# Patient Record
Sex: Male | Born: 1950 | Race: White | Hispanic: No | Marital: Married | Smoking: Never smoker
Health system: Southern US, Community
[De-identification: ages and names within clinical notes are randomized; demographics above are authoritative.]

## PROBLEM LIST (undated history)

## (undated) DIAGNOSIS — T8859XA Other complications of anesthesia, initial encounter: Secondary | ICD-10-CM

## (undated) DIAGNOSIS — C801 Malignant (primary) neoplasm, unspecified: Secondary | ICD-10-CM

## (undated) DIAGNOSIS — G4733 Obstructive sleep apnea (adult) (pediatric): Secondary | ICD-10-CM

## (undated) DIAGNOSIS — I10 Essential (primary) hypertension: Secondary | ICD-10-CM

## (undated) DIAGNOSIS — K219 Gastro-esophageal reflux disease without esophagitis: Secondary | ICD-10-CM

## (undated) DIAGNOSIS — F319 Bipolar disorder, unspecified: Secondary | ICD-10-CM

## (undated) DIAGNOSIS — R011 Cardiac murmur, unspecified: Secondary | ICD-10-CM

## (undated) DIAGNOSIS — E039 Hypothyroidism, unspecified: Secondary | ICD-10-CM

## (undated) DIAGNOSIS — Z87442 Personal history of urinary calculi: Secondary | ICD-10-CM

## (undated) DIAGNOSIS — E785 Hyperlipidemia, unspecified: Secondary | ICD-10-CM

## (undated) HISTORY — DX: Obstructive sleep apnea (adult) (pediatric): G47.33

## (undated) HISTORY — PX: JOINT REPLACEMENT: SHX530

## (undated) HISTORY — PX: REPLACEMENT TOTAL KNEE: SUR1224

## (undated) HISTORY — DX: Hyperlipidemia, unspecified: E78.5

## (undated) HISTORY — DX: Bipolar disorder, unspecified: F31.9

## (undated) HISTORY — DX: Essential (primary) hypertension: I10

## (undated) HISTORY — DX: Hypothyroidism, unspecified: E03.9

## (undated) HISTORY — PX: COLONOSCOPY: SHX174

## (undated) HISTORY — PX: OTHER SURGICAL HISTORY: SHX169

## (undated) HISTORY — DX: Gastro-esophageal reflux disease without esophagitis: K21.9

---

## 2007-07-12 HISTORY — PX: CARDIAC CATHETERIZATION: SHX172

## 2007-07-20 ENCOUNTER — Emergency Department (HOSPITAL_COMMUNITY): Admission: EM | Admit: 2007-07-20 | Discharge: 2007-07-20 | Payer: Self-pay | Admitting: Emergency Medicine

## 2007-08-10 ENCOUNTER — Encounter: Admission: RE | Admit: 2007-08-10 | Discharge: 2007-08-10 | Payer: Self-pay | Admitting: Family Medicine

## 2010-08-01 ENCOUNTER — Encounter: Payer: Self-pay | Admitting: Family Medicine

## 2010-10-05 ENCOUNTER — Ambulatory Visit
Admission: RE | Admit: 2010-10-05 | Discharge: 2010-10-05 | Disposition: A | Discharge status: Home / Self Care | Payer: 59 | Source: Ambulatory Visit | Attending: Family Medicine | Admitting: Family Medicine

## 2010-10-05 ENCOUNTER — Other Ambulatory Visit: Payer: Self-pay | Admitting: Family Medicine

## 2010-10-05 DIAGNOSIS — M25561 Pain in right knee: Secondary | ICD-10-CM

## 2011-02-01 ENCOUNTER — Ambulatory Visit (INDEPENDENT_AMBULATORY_CARE_PROVIDER_SITE_OTHER): Payer: 59 | Admitting: Family Medicine

## 2011-02-01 ENCOUNTER — Encounter: Payer: Self-pay | Admitting: Family Medicine

## 2011-02-01 VITALS — BP 123/87 | HR 103 | Temp 98.1°F | Ht 74.0 in | Wt 299.4 lb

## 2011-02-01 DIAGNOSIS — M25569 Pain in unspecified knee: Secondary | ICD-10-CM

## 2011-02-01 DIAGNOSIS — M25561 Pain in right knee: Secondary | ICD-10-CM | POA: Insufficient documentation

## 2011-02-01 NOTE — Progress Notes (Signed)
Subjective:    Patient ID: Noah Ward, male    DOB: Nov 01, 1950, 60 y.o.   MRN: [id number]  PCP: Dr. Luz Brazen  HPI 60 yo M here for right knee pain.  Patient reports back in 2001 when he jumped out of a truck with a machine gun while in the [employer] he twisted his right knee causing patellar dislocation. Went to ER - was put back in place and given a brace. This was first injury to this knee. Has never had surgery on this knee. He reports intermittent pain in right knee since that time but really bothering him past 6 months. Had x-rays showing patellofemoral DJD but these were non-weight bearing.  Medial and lateral joint spaces well preserved. About 6 weeks ago had cortisone injection in his knee for medial pain that helped a lot for a month. Pain recurred and on Friday had another injection. Feels better than before the injection. 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 Feels a catch and knee will buckle on him medially. No recurrent dislocation.  No lateral pain.  No locking. Takes tramadol as needed for pain.  Past Medical History  Diagnosis Date  . Hypertension   . Hyperlipidemia   . Hypothyroidism   . Bipolar affective disorder     No current outpatient prescriptions on file prior to visit.    History reviewed. No pertinent past surgical history.  No Known Allergies  History   Social History  . Marital Status: Married    Spouse Name: N/A    Number of Children: N/A  . Years of Education: N/A   Occupational History  . Not on file.   Social History Main Topics  . Smoking status: Never Smoker   . Smokeless tobacco: Not on file  . Alcohol Use: Not on file  . Drug Use: Not on file  . Sexually Active: Not on file   Other Topics Concern  . Not on file   Social History Narrative  . No narrative on file    Family History  Problem Relation Age of Onset  . Hypertension Mother   . Hypertension Father   .  Hyperlipidemia Father   . Heart attack Father   . Hypertension Brother   . Diabetes Maternal Aunt   . Hypertension Maternal Aunt   . Sudden death Neg Hx     BP 123/87  Pulse 103  Temp(Src) 98.1 F (36.7 C) (Oral)  Ht 6\' 2"  (1.88 m)  Wt 299 lb 6.4 oz (135.807 kg)  BMI 38.44 kg/m2  Review of Systems See HPI above.    Objective:   Physical Exam Gen: NAD  R knee: No gross deformity, ecchymoses, swelling. 1+ crepitation. No TTP currently - reported when I palpated medial joint line that this is where pain was prior to his knee injection Friday. FROM. 1+ anterior drawer and lachmanns.  Negative post drawer. Negative valgus/varus testing. Negative mcmurrays, apleys, patellar apprehension, clarkes. NV intact distally.  L knee: FROM without pain, swelling, weakness, or instability.  Negative ant drawer and lachmanns.    Assessment & Plan:  1. Right knee pain - 2/2 degenerative meniscal tear vs DJD.  While he has laxity on ACL testing and a prior history of patellar dislocation, these do not seem to be accounting for patients pain which is at medial joint line.  No true locking.  Just given an intraarticular cortisone injection which has helped.  Advised him if  he does not continue to improve with this for > 3 months relief, would then consider standing AP of his right knee (prior radiographs were with him lying down) and possibly MRI based on those results to assess for degenerative medial meniscal tear.  He does get what may be a catch medially causing buckling of this knee - possible this is a meniscal tear that flips into joint space.  Continue tramadol.  Tylenol, nsaids, icing.  Discussed possibility of PT as well but deferred for now.  See instructions for further.

## 2011-02-01 NOTE — Patient Instructions (Signed)
Take tylenol 500mg  1-2 tabs three times a Tacey for pain. Aleve 1-2 tabs twice a Arreola with food as needed Glucosamine sulfate 750mg  twice a Dowe is a supplement that has been shown to help moderate to severe arthritis. Capsaicin topically up to four times a Hoeffner may also help with pain. Cortisone injections are an option. It's important that you continue to stay active. If you are overweight, try to lose weight through diet and exercise. Consider physical therapy to strengthen muscles around the joint that hurts to take pressure off of the joint itself. Heat or ice 15 minutes at a time 3-4 times a Weesner as needed to help with pain. If you do not continue to improve I would consider standing AP (front to back) x-ray of your knee and MRI - you don't have much in the way of arthritis on the inside of your knee where most of your pain is suggesting it may be a degenerative meniscus tear that's causing your pain.

## 2011-02-01 NOTE — Assessment & Plan Note (Signed)
2/2 degenerative meniscal tear vs DJD.  While he has laxity on ACL testing and a prior history of patellar dislocation, these do not seem to be accounting for patients pain which is at medial joint line.  No true locking.  Just given an intraarticular cortisone injection which has helped.  Advised him if he does not continue to improve with this for > 3 months relief, would then consider standing AP of his right knee (prior radiographs were with him lying down) and possibly MRI based on those results to assess for degenerative medial meniscal tear.  He does get what may be a catch medially causing buckling of this knee - possible this is a meniscal tear that flips into joint space.  Continue tramadol.  Tylenol, nsaids, icing.  Discussed possibility of PT as well but deferred for now.  See instructions for further.

## 2011-03-31 LAB — BASIC METABOLIC PANEL
CO2: 28
Calcium: 8.8
Creatinine, Ser: 1.06
GFR calc Af Amer: >60
GFR calc non Af Amer: >60
Sodium: 132 — ABNORMAL LOW

## 2011-03-31 LAB — CBC
Hemoglobin: 13.6
RBC: 4.42
WBC: 10.3

## 2011-03-31 LAB — DIFFERENTIAL
Basophils Relative: 0
Lymphocytes Relative: 22
Lymphs Abs: 2.3
Monocytes Absolute: 1.3 — ABNORMAL HIGH
Monocytes Relative: 13 — ABNORMAL HIGH
Neutro Abs: 6.5
Neutrophils Relative %: 63

## 2011-06-14 ENCOUNTER — Emergency Department (INDEPENDENT_AMBULATORY_CARE_PROVIDER_SITE_OTHER): Payer: 59

## 2011-06-14 ENCOUNTER — Emergency Department (HOSPITAL_BASED_OUTPATIENT_CLINIC_OR_DEPARTMENT_OTHER)
Admission: EM | Admit: 2011-06-14 | Discharge: 2011-06-15 | Disposition: A | Discharge status: Home / Self Care | Payer: 59 | Attending: Emergency Medicine | Admitting: Emergency Medicine

## 2011-06-14 ENCOUNTER — Encounter (HOSPITAL_BASED_OUTPATIENT_CLINIC_OR_DEPARTMENT_OTHER): Payer: Self-pay | Admitting: Emergency Medicine

## 2011-06-14 DIAGNOSIS — E039 Hypothyroidism, unspecified: Secondary | ICD-10-CM | POA: Insufficient documentation

## 2011-06-14 DIAGNOSIS — I872 Venous insufficiency (chronic) (peripheral): Secondary | ICD-10-CM | POA: Insufficient documentation

## 2011-06-14 DIAGNOSIS — K5900 Constipation, unspecified: Secondary | ICD-10-CM | POA: Insufficient documentation

## 2011-06-14 DIAGNOSIS — Z79899 Other long term (current) drug therapy: Secondary | ICD-10-CM | POA: Insufficient documentation

## 2011-06-14 DIAGNOSIS — E785 Hyperlipidemia, unspecified: Secondary | ICD-10-CM | POA: Insufficient documentation

## 2011-06-14 DIAGNOSIS — I10 Essential (primary) hypertension: Secondary | ICD-10-CM | POA: Insufficient documentation

## 2011-06-14 DIAGNOSIS — I878 Other specified disorders of veins: Secondary | ICD-10-CM

## 2011-06-14 LAB — CBC
HCT: 34.6 % — ABNORMAL LOW (ref 39.0–52.0)
Hemoglobin: 11.3 g/dL — ABNORMAL LOW (ref 13.0–17.0)
RBC: 3.85 MIL/uL — ABNORMAL LOW (ref 4.22–5.81)
WBC: 14 10*3/uL — ABNORMAL HIGH (ref 4.0–10.5)

## 2011-06-14 MED ORDER — ONDANSETRON HCL 4 MG/2ML IJ SOLN
4.0000 mg | Freq: Once | INTRAMUSCULAR | Status: AC
  Administered 2011-06-15: 4 mg via INTRAVENOUS
  Filled 2011-06-14: qty 2

## 2011-06-14 MED ORDER — SODIUM CHLORIDE 0.9 % IV BOLUS (SEPSIS)
1000.0000 mL | Freq: Once | INTRAVENOUS | Status: AC
  Administered 2011-06-15: 1000 mL via INTRAVENOUS

## 2011-06-14 NOTE — ED Provider Notes (Signed)
History     CSN: [id number] Arrival date & time: 06/14/2011  9:04 PM   First MD Initiated Contact with Patient 06/14/11 2312      Chief Complaint  Patient presents with  . Constipation    (Consider location/radiation/quality/duration/timing/severity/associated sxs/prior treatment) HPI Patient here with constipation since knee replacement 8 days ago.  Patient taking oxycodone for pain.  States decreased po intake and denies any bowel movements.  No abdominal pain.  Patient has taken four senna plus a [place].  Patient states he is weak.  He has taken miralax last night but continues to have no stool.     Past Medical History  Diagnosis Date  . Hypertension   . Hyperlipidemia   . Hypothyroidism   . Bipolar affective disorder     Past Surgical History  Procedure Date  . Joint replacement     Family History  Problem Relation Age of Onset  . Hypertension Mother   . Hypertension Father   . Hyperlipidemia Father   . Heart attack Father   . Hypertension Brother   . Diabetes Maternal Aunt   . Hypertension Maternal Aunt   . Sudden death Neg Hx     History  Substance Use Topics  . Smoking status: Never Smoker   . Smokeless tobacco: Not on file  . Alcohol Use: Not on file      Review of Systems  Constitutional: Positive for activity change and appetite change.  All other systems reviewed and are negative.    Allergies  Sulfa antibiotics  Home Medications   Current Outpatient Rx  Name Route Sig Dispense Refill  . AMLODIPINE BESY-BENAZEPRIL HCL 5-20 MG PO CAPS Oral Take 1 capsule by mouth daily.     . CELECOXIB 200 MG PO CAPS Oral Take 200 mg by mouth 2 (two) times daily.      Marland Kitchen VITAMIN D 1000 UNITS PO TABS Oral Take 1,000 Units by mouth daily.      Marland Kitchen DIVALPROEX SODIUM 500 MG PO TBEC Oral Take 1,000 mg by mouth at bedtime.     Marland Kitchen ENOXAPARIN SODIUM 30 MG/0.3ML [place] SOLN Subcutaneous Inject 30 mg into the skin every 12 (twelve) hours.      Marland Kitchen LEVOTHYROXINE SODIUM 25 MCG  PO TABS Oral Take 25 mcg by mouth daily.     Marland Kitchen ONE-DAILY MULTI VITAMINS PO TABS Oral Take 1 tablet by mouth daily.      . OXYCODONE HCL 5 MG PO CAPS Oral Take 5-15 mg by mouth every 3 (three) hours as needed. For pain. Take 1 tab for minor pain; take 2 tabs for moderate pain and 3 tabs for severe pain     . POLYSACCHARIDE IRON 150 MG PO CAPS Oral Take 150 mg by mouth daily.      Marland Kitchen SIMVASTATIN 10 MG PO TABS Oral Take 10 mg by mouth at bedtime.     . TESTOSTERONE ENANTHATE 200 MG/ML IM OIL Intramuscular Inject 200 mg into the muscle every 14 (fourteen) days. For IM use only     . TRAMADOL HCL 50 MG PO TABS        BP 103/61  Pulse 111  Temp(Src) 98.6 F (37 C) (Oral)  Resp 18  Wt 301 lb (136.533 kg)  SpO2 96%  Physical Exam  Nursing note and vitals reviewed. Constitutional: He is oriented to person, place, and time. He appears well-developed and well-nourished.       obese  HENT:  Head: Normocephalic and atraumatic.  Eyes:  Conjunctivae and EOM are normal. Pupils are equal, round, and reactive to light.  Neck: Normal range of motion. Neck supple.  Cardiovascular: Normal rate and regular rhythm.   Pulmonary/Chest: Effort normal and breath sounds normal.  Abdominal: Soft.  Musculoskeletal:       Right knee with dressing place, knee with steri strips some swelling diffusely, erythema right calf with some warmth no well delineated.  Neurological: He is alert and oriented to person, place, and time. He has normal reflexes.  Skin: Skin is warm and dry.    ED Course  Procedures (including critical care time)  Labs Reviewed - No data to display Dg Abd 1 View  06/14/2011  *RADIOLOGY REPORT*  Clinical Data: Constipation  ABDOMEN - 1 VIEW  Comparison: None.  Findings: Moderate to large amount of stool in the right colon. Retained stool also in the transverse and left colon but to a lesser degree than in the right colon.  Negative for bowel obstruction.  No dilated bowel loops are seen. No  acute bony abnormality.  No renal calculi.  IMPRESSION: Constipation without bowel obstruction.  Original Report Authenticated By: Camelia Phenes, M.D.     No diagnosis found.    MDM  Patient with constipation without s/s obstruction.  Nurse checked rectum and no stool palpable per rn.  Patient with some erythema rle below recent knee surgery which appears c.w. Venous stasis but some warmth and wbc at 14,000.  Plan antibiotics, laxative, advised regarding bowel habits and patient to follow up with pmd tomorrow.          Hilario Quarry, MD 06/15/11 [phone]

## 2011-06-14 NOTE — ED Notes (Signed)
Pt had knee replacement surgery, discharged 8 days ago. Pt has not had no BM x 8 days.

## 2011-06-15 LAB — DIFFERENTIAL
Basophils Relative: 0 % (ref 0–1)
Eosinophils Relative: 2 % (ref 0–5)
Lymphocytes Relative: 21 % (ref 12–46)
Monocytes Relative: 12 % (ref 3–12)
Neutro Abs: 9.1 10*3/uL — ABNORMAL HIGH (ref 1.7–7.7)
Neutrophils Relative %: 65 % (ref 43–77)

## 2011-06-15 LAB — COMPREHENSIVE METABOLIC PANEL
ALT: 25 U/L (ref 0–53)
Alkaline Phosphatase: 63 U/L (ref 39–117)
BUN: 25 mg/dL — ABNORMAL HIGH (ref 6–23)
Chloride: 98 mEq/L (ref 96–112)
GFR calc Af Amer: >90 mL/min (ref >90)
Glucose, Bld: 103 mg/dL — ABNORMAL HIGH (ref 70–99)
Potassium: 5.1 mEq/L (ref 3.5–5.1)
Sodium: 134 mEq/L — ABNORMAL LOW (ref 135–145)
Total Bilirubin: 1.1 mg/dL (ref 0.3–1.2)

## 2011-06-15 MED ORDER — DOXYCYCLINE HYCLATE 100 MG PO TABS
100.0000 mg | ORAL_TABLET | Freq: Once | ORAL | Status: AC
  Administered 2011-06-15: 100 mg via ORAL
  Filled 2011-06-15: qty 1

## 2011-06-15 MED ORDER — LEVOFLOXACIN 500 MG PO TABS: 500.0000 mg | ORAL_TABLET | Freq: Every day | ORAL | Status: AC

## 2011-06-15 MED ORDER — MAGNESIUM CITRATE PO SOLN: 296.0000 mL | Freq: Once | ORAL | Status: AC

## 2013-06-21 ENCOUNTER — Encounter (HOSPITAL_BASED_OUTPATIENT_CLINIC_OR_DEPARTMENT_OTHER): Payer: Self-pay | Admitting: Emergency Medicine

## 2013-06-21 ENCOUNTER — Emergency Department (HOSPITAL_BASED_OUTPATIENT_CLINIC_OR_DEPARTMENT_OTHER)

## 2013-06-21 ENCOUNTER — Emergency Department (HOSPITAL_BASED_OUTPATIENT_CLINIC_OR_DEPARTMENT_OTHER)
Admission: EM | Admit: 2013-06-21 | Discharge: 2013-06-21 | Disposition: A | Discharge status: Home / Self Care | Attending: Emergency Medicine | Admitting: Emergency Medicine

## 2013-06-21 DIAGNOSIS — R296 Repeated falls: Secondary | ICD-10-CM | POA: Insufficient documentation

## 2013-06-21 DIAGNOSIS — S8392XA Sprain of unspecified site of left knee, initial encounter: Secondary | ICD-10-CM

## 2013-06-21 DIAGNOSIS — Y9301 Activity, walking, marching and hiking: Secondary | ICD-10-CM | POA: Insufficient documentation

## 2013-06-21 DIAGNOSIS — F319 Bipolar disorder, unspecified: Secondary | ICD-10-CM | POA: Insufficient documentation

## 2013-06-21 DIAGNOSIS — Z79899 Other long term (current) drug therapy: Secondary | ICD-10-CM | POA: Insufficient documentation

## 2013-06-21 DIAGNOSIS — I10 Essential (primary) hypertension: Secondary | ICD-10-CM | POA: Insufficient documentation

## 2013-06-21 DIAGNOSIS — Y929 Unspecified place or not applicable: Secondary | ICD-10-CM | POA: Insufficient documentation

## 2013-06-21 DIAGNOSIS — E785 Hyperlipidemia, unspecified: Secondary | ICD-10-CM | POA: Insufficient documentation

## 2013-06-21 DIAGNOSIS — E039 Hypothyroidism, unspecified: Secondary | ICD-10-CM | POA: Insufficient documentation

## 2013-06-21 DIAGNOSIS — S93409A Sprain of unspecified ligament of unspecified ankle, initial encounter: Secondary | ICD-10-CM | POA: Insufficient documentation

## 2013-06-21 DIAGNOSIS — Z791 Long term (current) use of non-steroidal anti-inflammatories (NSAID): Secondary | ICD-10-CM | POA: Insufficient documentation

## 2013-06-21 DIAGNOSIS — IMO0002 Reserved for concepts with insufficient information to code with codable children: Secondary | ICD-10-CM | POA: Insufficient documentation

## 2013-06-21 DIAGNOSIS — X500XXA Overexertion from strenuous movement or load, initial encounter: Secondary | ICD-10-CM | POA: Insufficient documentation

## 2013-06-21 MED ORDER — TRAMADOL HCL 50 MG PO TABS: 50.0000 mg | ORAL_TABLET | Freq: Four times a day (QID) | ORAL | Status: DC | PRN

## 2013-06-21 NOTE — ED Notes (Addendum)
Pt states that he had his left knee replaced in March at [facility] and today was walking and a hole covered with leaves he fell in, pt landed mainly on left leg and has left knee pain and left ankle pain. Pt states that he twisted and turned both knee and ankle. Pt cap refill less this 3, CNS intact, and pt able to wiggle toes. Ice applied to both knee and ankle for comfort.

## 2013-06-21 NOTE — ED Provider Notes (Signed)
CSN: [id number]     Arrival date & time 06/21/13  1521 History   First MD Initiated Contact with Patient 06/21/13 1536     Chief Complaint  Patient presents with  . Knee Injury   (Consider location/radiation/quality/duration/timing/severity/associated sxs/prior Treatment) Patient is a 62 y.o. male presenting with fall. The history is provided by the patient.  Fall This is a new problem. The current episode started less than 1 hour ago. The problem occurs constantly. The problem has not changed since onset.Associated symptoms comments: Stepped in a hole covered by leaves causing him to fall and twist his left knee and ankle.  Unable to bear weight since due to severe ankle and moderate knee pain.  No head injury or LOC.  No neck or back pain.. The symptoms are aggravated by walking and standing. The symptoms are relieved by ice and rest. Treatments tried: ice. The treatment provided mild relief.    Past Medical History  Diagnosis Date  . Hypertension   . Hyperlipidemia   . Hypothyroidism   . Bipolar affective disorder    Past Surgical History  Procedure Laterality Date  . Joint replacement     Family History  Problem Relation Age of Onset  . Hypertension Mother   . Hypertension Father   . Hyperlipidemia Father   . Heart attack Father   . Hypertension Brother   . Diabetes Maternal Aunt   . Hypertension Maternal Aunt   . Sudden death Neg Hx    History  Substance Use Topics  . Smoking status: Never Smoker   . Smokeless tobacco: Not on file  . Alcohol Use: Not on file    Review of Systems  All other systems reviewed and are negative.    Allergies  Sulfa antibiotics  Home Medications   Current Outpatient Rx  Name  Route  Sig  Dispense  Refill  . amLODipine-benazepril (LOTREL) 5-20 MG per capsule   Oral   Take 1 capsule by mouth daily.          . celecoxib (CELEBREX) 200 MG capsule   Oral   Take 200 mg by mouth 2 (two) times daily.           .  cholecalciferol (VITAMIN D) 1000 UNITS tablet   Oral   Take 1,000 Units by mouth daily.           . divalproex (DEPAKOTE) 500 MG EC tablet   Oral   Take 1,000 mg by mouth at bedtime.          . enoxaparin (LOVENOX) 30 MG/0.3ML SOLN   Subcutaneous   Inject 30 mg into the skin every 12 (twelve) hours.           Marland Kitchen levothyroxine (SYNTHROID, LEVOTHROID) 25 MCG tablet   Oral   Take 25 mcg by mouth daily.          . Multiple Vitamin (MULTIVITAMIN) tablet   Oral   Take 1 tablet by mouth daily.           Marland Kitchen oxycodone (OXY-IR) 5 MG capsule   Oral   Take 5-15 mg by mouth every 3 (three) hours as needed. For pain. Take 1 tab for minor pain; take 2 tabs for moderate pain and 3 tabs for severe pain          . polysaccharide iron (NIFEREX) 150 MG CAPS capsule   Oral   Take 150 mg by mouth daily.           Marland Kitchen  simvastatin (ZOCOR) 10 MG tablet   Oral   Take 10 mg by mouth at bedtime.          Marland Kitchen testosterone enanthate (DELATESTRYL) 200 MG/ML injection   Intramuscular   Inject 200 mg into the muscle every 14 (fourteen) days. For IM use only          . traMADol (ULTRAM) 50 MG tablet               . traMADol (ULTRAM) 50 MG tablet   Oral   Take 1 tablet (50 mg total) by mouth every 6 (six) hours as needed.   15 tablet   0    BP 169/106  Pulse 107  Temp(Src) 98.9 F (37.2 C) (Oral)  Resp 16  Ht 6\' 2"  (1.88 m)  Wt 297 lb (134.718 kg)  BMI 38.12 kg/m2  SpO2 98% Physical Exam  Nursing note and vitals reviewed. Constitutional: He is oriented to person, place, and time. He appears well-developed and well-nourished. No distress.  HENT:  Head: Normocephalic and atraumatic.  Eyes: EOM are normal. Pupils are equal, round, and reactive to light.  Cardiovascular: Normal rate.   Pulmonary/Chest: Effort normal.  Musculoskeletal:       Left knee: He exhibits swelling and ecchymosis. He exhibits normal range of motion. Tenderness found. Lateral joint line tenderness noted.        Left ankle: He exhibits decreased range of motion, swelling and ecchymosis. Tenderness. Lateral malleolus tenderness found. No head of 5th metatarsal and no proximal fibula tenderness found.  >90 degree flexion of the left knee  Neurological: He is alert and oriented to person, place, and time. He has normal strength. No sensory deficit.  Skin: Skin is warm and dry. No rash noted. No erythema.  Psychiatric: He has a normal mood and affect. His behavior is normal.    ED Course  Procedures (including critical care time) Labs Review Labs Reviewed - No data to display Imaging Review Dg Ankle Complete Left  06/21/2013   CLINICAL DATA:  Left ankle pain  EXAM: LEFT ANKLE COMPLETE - 3+ VIEW  COMPARISON:  None.  FINDINGS: Generalized soft tissue swelling is noted both medially and laterally. No acute fracture or dislocation is noted. Calcaneal spurs are seen. No other focal abnormality is noted.  IMPRESSION: Soft tissue swelling without acute bony abnormality.   Electronically Signed   By: Alcide Clever M.D.   On: 06/21/2013 16:00   Dg Knee Complete 4 Views Left  06/21/2013   CLINICAL DATA:  Traumatic injury with pain  EXAM: LEFT KNEE - COMPLETE 4+ VIEW  COMPARISON:  None.  FINDINGS: Left knee prosthesis is noted. No loosening is seen. No acute fracture or dislocation is noted. Small joint effusion is noted.  IMPRESSION: Small joint effusion.  No acute bony abnormality is seen.   Electronically Signed   By: Alcide Clever M.D.   On: 06/21/2013 15:58    EKG Interpretation   None       MDM   1. Knee sprain and strain, left, initial encounter   2. Ankle sprain and strain, left, initial encounter     Patient here for mechanical fall where he stepped in a hole and twisted his knee and ankle. Patient has a prior history of a complete left knee replacement. He has greater than 90 flexion of the left knee with pain over the lateral aspect and mild bruising. Left ankle with significant pain and  swelling over the lateral malleolus. 2+  DP and PT pulses. No fibular head tenderness. Currently both joints do not seem unstable. Plain films without acute fracture or or problems with the left knee prosthesis. Left knee wrapped and placed in an ankle splint. Patient will follow up with his orthopedist in one to 2 weeks. He has a walker at home    Gwyneth Sprout, MD 06/21/13 [phone]

## 2013-06-21 NOTE — ED Notes (Signed)
Pt c/o left knee and ankle injury with fall x 1 hr ago

## 2014-01-06 ENCOUNTER — Encounter: Payer: Self-pay | Admitting: *Deleted

## 2015-05-14 ENCOUNTER — Emergency Department (HOSPITAL_BASED_OUTPATIENT_CLINIC_OR_DEPARTMENT_OTHER): Payer: Medicare Other

## 2015-05-14 ENCOUNTER — Emergency Department (HOSPITAL_BASED_OUTPATIENT_CLINIC_OR_DEPARTMENT_OTHER)
Admission: EM | Admit: 2015-05-14 | Discharge: 2015-05-14 | Disposition: A | Discharge status: Home / Self Care | Payer: Medicare Other | Attending: Emergency Medicine | Admitting: Emergency Medicine

## 2015-05-14 ENCOUNTER — Encounter (HOSPITAL_BASED_OUTPATIENT_CLINIC_OR_DEPARTMENT_OTHER): Payer: Self-pay | Admitting: *Deleted

## 2015-05-14 DIAGNOSIS — Z791 Long term (current) use of non-steroidal anti-inflammatories (NSAID): Secondary | ICD-10-CM | POA: Diagnosis not present

## 2015-05-14 DIAGNOSIS — E039 Hypothyroidism, unspecified: Secondary | ICD-10-CM | POA: Insufficient documentation

## 2015-05-14 DIAGNOSIS — R42 Dizziness and giddiness: Secondary | ICD-10-CM | POA: Diagnosis present

## 2015-05-14 DIAGNOSIS — Z79899 Other long term (current) drug therapy: Secondary | ICD-10-CM | POA: Diagnosis not present

## 2015-05-14 DIAGNOSIS — F319 Bipolar disorder, unspecified: Secondary | ICD-10-CM | POA: Diagnosis not present

## 2015-05-14 DIAGNOSIS — Z9981 Dependence on supplemental oxygen: Secondary | ICD-10-CM | POA: Diagnosis not present

## 2015-05-14 DIAGNOSIS — E785 Hyperlipidemia, unspecified: Secondary | ICD-10-CM | POA: Diagnosis not present

## 2015-05-14 DIAGNOSIS — G4733 Obstructive sleep apnea (adult) (pediatric): Secondary | ICD-10-CM | POA: Insufficient documentation

## 2015-05-14 DIAGNOSIS — I10 Essential (primary) hypertension: Secondary | ICD-10-CM | POA: Diagnosis not present

## 2015-05-14 DIAGNOSIS — Z8719 Personal history of other diseases of the digestive system: Secondary | ICD-10-CM | POA: Insufficient documentation

## 2015-05-14 LAB — COMPREHENSIVE METABOLIC PANEL
ALBUMIN: 4 g/dL (ref 3.5–5.0)
ALT: 18 U/L (ref 17–63)
ANION GAP: 7 (ref 5–15)
AST: 21 U/L (ref 15–41)
Alkaline Phosphatase: 51 U/L (ref 38–126)
BILIRUBIN TOTAL: 0.7 mg/dL (ref 0.3–1.2)
BUN: 15 mg/dL (ref 6–20)
CHLORIDE: 103 mmol/L (ref 101–111)
CO2: 26 mmol/L (ref 22–32)
Calcium: 9.2 mg/dL (ref 8.9–10.3)
Creatinine, Ser: 1.05 mg/dL (ref 0.61–1.24)
GFR calc Af Amer: >60 mL/min (ref >60)
GFR calc non Af Amer: >60 mL/min (ref >60)
GLUCOSE: 102 mg/dL — AB (ref 65–99)
POTASSIUM: 4.5 mmol/L (ref 3.5–5.1)
SODIUM: 136 mmol/L (ref 135–145)
TOTAL PROTEIN: 7.3 g/dL (ref 6.5–8.1)

## 2015-05-14 LAB — CBC
HEMATOCRIT: 45.1 % (ref 39.0–52.0)
Hemoglobin: 14.8 g/dL (ref 13.0–17.0)
MCH: 29.6 pg (ref 26.0–34.0)
MCHC: 32.8 g/dL (ref 30.0–36.0)
MCV: 90.2 fL (ref 78.0–100.0)
Platelets: 221 10*3/uL (ref 150–400)
RBC: 5 MIL/uL (ref 4.22–5.81)
RDW: 13.7 % (ref 11.5–15.5)
WBC: 7 10*3/uL (ref 4.0–10.5)

## 2015-05-14 LAB — TROPONIN I: Troponin I: <0.03 ng/mL (ref <0.031)

## 2015-05-14 MED ORDER — MECLIZINE HCL 50 MG PO TABS
25.0000 mg | ORAL_TABLET | Freq: Three times a day (TID) | ORAL | Status: DC | PRN
Start: 2015-05-14 — End: 2021-04-01

## 2015-05-14 MED ORDER — MECLIZINE HCL 25 MG PO TABS
25.0000 mg | ORAL_TABLET | Freq: Once | ORAL | Status: AC
  Administered 2015-05-14: 25 mg via ORAL
  Filled 2015-05-14: qty 1

## 2015-05-14 NOTE — ED Provider Notes (Signed)
CSN: [id number]     Arrival date & time 05/14/15  1144 History   First MD Initiated Contact with Patient 05/14/15 1323     Chief Complaint  Patient presents with  . Dizziness     (Consider location/radiation/quality/duration/timing/severity/associated sxs/prior Treatment) HPI Complaint of dizziness meeting sensation of room spinning onset yesterday afternoon. Symptoms last for 15-20 seconds at a time worse with moving his head improved with remaining still also associated with nausea. No other symptoms. No treatment prior to coming here no headache no diplopia and no focal numbness or weakness. He feels improved today over yesterday. Past Medical History  Diagnosis Date  . Hypertension   . Hyperlipidemia   . Hypothyroidism   . Bipolar affective disorder ([place])   . Esophageal reflux   . OSA treated with BiPAP    Past Surgical History  Procedure Laterality Date  . Joint replacement    . Colonoscopy    . Cardiac catheterization  2009   Family History  Problem Relation Age of Onset  . Hypertension Mother   . Hypertension Father   . Hyperlipidemia Father   . Heart attack Father   . Hypertension Brother   . Diabetes Maternal Aunt   . Hypertension Maternal Aunt   . Sudden death Neg Hx    Social History  Substance Use Topics  . Smoking status: Never Smoker   . Smokeless tobacco: None  . Alcohol Use: No    Review of Systems  Constitutional: Negative.   HENT: Positive for tinnitus.        Chronic tinnitus  Respiratory: Negative.   Cardiovascular: Negative.   Gastrointestinal: Negative.   Musculoskeletal: Negative.   Skin: Negative.   Neurological: Positive for dizziness.  Psychiatric/Behavioral: Negative.   All other systems reviewed and are negative.     Allergies  12 hour nasal spray; Crestor; and Sulfa antibiotics  Home Medications   Prior to Admission medications   Medication Sig Start Date End Date Taking? Authorizing Provider  amLODipine-benazepril  (LOTREL) 5-20 MG per capsule Take 1 capsule by mouth daily.  12/09/10   Historical Provider, MD  celecoxib (CELEBREX) 200 MG capsule Take 200 mg by mouth 2 (two) times daily.      Historical Provider, MD  cholecalciferol (VITAMIN D) 1000 UNITS tablet Take 1,000 Units by mouth daily.      Historical Provider, MD  divalproex (DEPAKOTE) 500 MG EC tablet Take 1,000 mg by mouth at bedtime.  01/13/11   Historical Provider, MD  enoxaparin (LOVENOX) 30 MG/0.3ML SOLN Inject 30 mg into the skin every 12 (twelve) hours.      Historical Provider, MD  levothyroxine (SYNTHROID, LEVOTHROID) 25 MCG tablet Take 25 mcg by mouth daily.  01/28/11   Historical Provider, MD  Multiple Vitamin (MULTIVITAMIN) tablet Take 1 tablet by mouth daily.      Historical Provider, MD  oxycodone (OXY-IR) 5 MG capsule Take 5-15 mg by mouth every 3 (three) hours as needed. For pain. Take 1 tab for minor pain; take 2 tabs for moderate pain and 3 tabs for severe pain     Historical Provider, MD  polysaccharide iron (NIFEREX) 150 MG CAPS capsule Take 150 mg by mouth daily.      Historical Provider, MD  simvastatin (ZOCOR) 10 MG tablet Take 10 mg by mouth at bedtime.  01/28/11   Historical Provider, MD  testosterone enanthate (DELATESTRYL) 200 MG/ML injection Inject 200 mg into the muscle every 14 (fourteen) days. For IM use only  Historical Provider, MD  traMADol Veatrice Bourbon) 50 MG tablet  11/26/10   Historical Provider, MD  traMADol (ULTRAM) 50 MG tablet Take 1 tablet (50 mg total) by mouth every 6 (six) hours as needed. 06/21/13   Blanchie Dessert, MD   BP 121/94 mmHg  Pulse 82  Temp(Src) 97.8 F (36.6 C) (Oral)  Resp 16  Ht 6\' 2"  (1.88 m)  Wt 305 lb (138.347 kg)  BMI 39.14 kg/m2  SpO2 98% Physical Exam  Constitutional: He is oriented to person, place, and time. He appears well-developed and well-nourished.  HENT:  Head: Normocephalic and atraumatic.  Eyes: Conjunctivae are normal. Pupils are equal, round, and reactive to light.   Neck: Neck supple. No tracheal deviation present. No thyromegaly present.  No bruit  Cardiovascular: Normal rate and regular rhythm.   No murmur heard. Pulmonary/Chest: Effort normal and breath sounds normal.  Abdominal: Soft. Bowel sounds are normal. He exhibits no distension. There is no tenderness.  Obese  Musculoskeletal: Normal range of motion. He exhibits no edema or tenderness.  Neurological: He is alert and oriented to person, place, and time. No cranial nerve deficit. Coordination normal.  Gait normal Romberg normal pronator drift normal finger to nose normal. He becomes mildly vertiginous upon rotating his neck  Skin: Skin is warm and dry. No rash noted.  Psychiatric: He has a normal mood and affect.  Nursing note and vitals reviewed.   ED Course  Procedures (including critical care time) Labs Review Labs Reviewed  CBC  TROPONIN I  COMPREHENSIVE METABOLIC PANEL  URINALYSIS, ROUTINE W REFLEX MICROSCOPIC (NOT AT [facility])    Imaging Review Dg Chest 2 View  05/14/2015  CLINICAL DATA:  Dizziness.  Hypertension. EXAM: CHEST  2 VIEW COMPARISON:  July 20, 2007 FINDINGS: Lungs are clear. Heart size and pulmonary vascularity are normal. No adenopathy. There is degenerative change in thoracic spine. IMPRESSION: No edema or consolidation. Electronically Signed   By: Lowella Grip III M.D.   On: 05/14/2015 13:12   I have personally reviewed and evaluated these images and lab results as part of my medical decision-making.   EKG Interpretation   Date/Time:  Thursday May 14 2015 12:13:47 EDT Ventricular Rate:  77 PR Interval:  160 QRS Duration: 118 QT Interval:  394 QTC Calculation: 445 R Axis:   22 Text Interpretation:  Normal sinus rhythm Incomplete right bundle branch  block Borderline ECG No significant change since last tracing Confirmed by  Winfred Leeds  MD, Divonte Senger [phone]) on 05/14/2015 2:27:45 PM     2:45 PM feels much improved after treatment with  meclizine. Results for orders placed or performed during the hospital encounter of 05/14/15  CBC  Result Value Ref Range   WBC 7.0 4.0 - 10.5 K/uL   RBC 5.00 4.22 - 5.81 MIL/uL   Hemoglobin 14.8 13.0 - 17.0 g/dL   HCT 45.1 39.0 - 52.0 %   MCV 90.2 78.0 - 100.0 fL   MCH 29.6 26.0 - 34.0 pg   MCHC 32.8 30.0 - 36.0 g/dL   RDW 13.7 11.5 - 15.5 %   Platelets 221 150 - 400 K/uL  Comprehensive metabolic panel  Result Value Ref Range   Sodium 136 135 - 145 mmol/L   Potassium 4.5 3.5 - 5.1 mmol/L   Chloride 103 101 - 111 mmol/L   CO2 26 22 - 32 mmol/L   Glucose, Bld 102 (H) 65 - 99 mg/dL   BUN 15 6 - 20 mg/dL   Creatinine, Ser 1.05 0.61 -  1.24 mg/dL   Calcium 9.2 8.9 - 10.3 mg/dL   Total Protein 7.3 6.5 - 8.1 g/dL   Albumin 4.0 3.5 - 5.0 g/dL   AST 21 15 - 41 U/L   ALT 18 17 - 63 U/L   Alkaline Phosphatase 51 38 - 126 U/L   Total Bilirubin 0.7 0.3 - 1.2 mg/dL   GFR calc non Af Amer >60 >60 mL/min   GFR calc Af Amer >60 >60 mL/min   Anion gap 7 5 - 15  Troponin I  Result Value Ref Range   Troponin I <0.03 <0.031 ng/mL   Dg Chest 2 View  05/14/2015  CLINICAL DATA:  Dizziness.  Hypertension. EXAM: CHEST  2 VIEW COMPARISON:  July 20, 2007 FINDINGS: Lungs are clear. Heart size and pulmonary vascularity are normal. No adenopathy. There is degenerative change in thoracic spine. IMPRESSION: No edema or consolidation. Electronically Signed   By: Lowella Grip III M.D.   On: 05/14/2015 13:12    MDM  Exam and history is consistent with benign positional vertigo Final diagnoses:  None   plan prescription meclizine. Follow-up with PMD if not continuing to improve by next week Diagnosis vertigo      Orlie Dakin, MD 05/14/15 [phone]

## 2015-05-14 NOTE — ED Notes (Signed)
MD at bedside. 

## 2015-05-14 NOTE — ED Notes (Signed)
Dizziness since yesterday. This am when he woke he was unable to stand due to dizziness. Neuro intact.

## 2015-05-14 NOTE — Discharge Instructions (Signed)
Benign Positional Vertigo See your primary care physician if continued have dizziness by next week. Return if your condition worsens for any reason Vertigo is the feeling that you or your surroundings are moving when they are not. Benign positional vertigo is the most common form of vertigo. The cause of this condition is not serious (is benign). This condition is triggered by certain movements and positions (is positional). This condition can be dangerous if it occurs while you are doing something that could endanger you or others, such as driving.  CAUSES In many cases, the cause of this condition is not known. It may be caused by a disturbance in an area of the inner ear that helps your brain to sense movement and balance. This disturbance can be caused by a viral infection (labyrinthitis), head injury, or repetitive motion. RISK FACTORS This condition is more likely to develop in:  Women.  People who are 64 years of age or older. SYMPTOMS Symptoms of this condition usually happen when you move your head or your eyes in different directions. Symptoms may start suddenly, and they usually last for less than a minute. Symptoms may include:  Loss of balance and falling.  Feeling like you are spinning or moving.  Feeling like your surroundings are spinning or moving.  Nausea and vomiting.  Blurred vision.  Dizziness.  Involuntary eye movement (nystagmus). Symptoms can be mild and cause only slight annoyance, or they can be severe and interfere with daily life. Episodes of benign positional vertigo may return (recur) over time, and they may be triggered by certain movements. Symptoms may improve over time. DIAGNOSIS This condition is usually diagnosed by medical history and a physical exam of the head, neck, and ears. You may be referred to a health care provider who specializes in ear, nose, and throat (ENT) problems (otolaryngologist) or a provider who specializes in disorders of the  nervous system (neurologist). You may have additional testing, including:  MRI.  A CT scan.  Eye movement tests. Your health care provider may ask you to change positions quickly while he or she watches you for symptoms of benign positional vertigo, such as nystagmus. Eye movement may be tested with an electronystagmogram (ENG), caloric stimulation, the Dix-Hallpike test, or the roll test.  An electroencephalogram (EEG). This records electrical activity in your brain.  Hearing tests. TREATMENT Usually, your health care provider will treat this by moving your head in specific positions to adjust your inner ear back to normal. Surgery may be needed in severe cases, but this is rare. In some cases, benign positional vertigo may resolve on its own in 2-4 weeks. HOME CARE INSTRUCTIONS Safety  Move slowly.Avoid sudden body or head movements.  Avoid driving.  Avoid operating heavy machinery.  Avoid doing any tasks that would be dangerous to you or others if a vertigo episode would occur.  If you have trouble walking or keeping your balance, try using a cane for stability. If you feel dizzy or unstable, sit down right away.  Return to your normal activities as told by your health care provider. Ask your health care provider what activities are safe for you. General Instructions  Take over-the-counter and prescription medicines only as told by your health care provider.  Avoid certain positions or movements as told by your health care provider.  Drink enough fluid to keep your urine clear or pale yellow.  Keep all follow-up visits as told by your health care provider. This is important. SEEK MEDICAL CARE IF:  You have a fever.  Your condition gets worse or you develop new symptoms.  Your family or friends notice any behavioral changes.  Your nausea or vomiting gets worse.  You have numbness or a "pins and needles" sensation. SEEK IMMEDIATE MEDICAL CARE IF:  You have  difficulty speaking or moving.  You are always dizzy.  You faint.  You develop severe headaches.  You have weakness in your legs or arms.  You have changes in your hearing or vision.  You develop a stiff neck.  You develop sensitivity to light.   This information is not intended to replace advice given to you by your health care provider. Make sure you discuss any questions you have with your health care provider.   Document Released: 04/04/2006 Document Revised: 03/18/2015 Document Reviewed: 10/20/2014 Elsevier Interactive Patient Education [employer].

## 2017-04-02 ENCOUNTER — Encounter (HOSPITAL_BASED_OUTPATIENT_CLINIC_OR_DEPARTMENT_OTHER): Payer: Self-pay | Admitting: Emergency Medicine

## 2017-04-02 ENCOUNTER — Emergency Department (HOSPITAL_BASED_OUTPATIENT_CLINIC_OR_DEPARTMENT_OTHER)
Admission: EM | Admit: 2017-04-02 | Discharge: 2017-04-03 | Disposition: A | Discharge status: Home / Self Care | Payer: Medicare Other | Attending: Emergency Medicine | Admitting: Emergency Medicine

## 2017-04-02 DIAGNOSIS — L03031 Cellulitis of right toe: Secondary | ICD-10-CM | POA: Insufficient documentation

## 2017-04-02 DIAGNOSIS — E785 Hyperlipidemia, unspecified: Secondary | ICD-10-CM | POA: Diagnosis not present

## 2017-04-02 DIAGNOSIS — M79674 Pain in right toe(s): Secondary | ICD-10-CM | POA: Diagnosis present

## 2017-04-02 DIAGNOSIS — Z79899 Other long term (current) drug therapy: Secondary | ICD-10-CM | POA: Diagnosis not present

## 2017-04-02 DIAGNOSIS — I10 Essential (primary) hypertension: Secondary | ICD-10-CM | POA: Diagnosis not present

## 2017-04-02 MED ORDER — KETOROLAC TROMETHAMINE 30 MG/ML IJ SOLN
30.0000 mg | Freq: Once | INTRAMUSCULAR | Status: AC
  Administered 2017-04-03: 30 mg via INTRAMUSCULAR
  Filled 2017-04-02: qty 1

## 2017-04-02 MED ORDER — DOXYCYCLINE HYCLATE 100 MG PO CAPS: 100.0000 mg | ORAL_CAPSULE | Freq: Two times a day (BID) | ORAL | 0 refills | Status: DC

## 2017-04-02 MED ORDER — HYDROCODONE-ACETAMINOPHEN 5-325 MG PO TABS: 1.0000 | ORAL_TABLET | Freq: Four times a day (QID) | ORAL | 0 refills | Status: DC | PRN

## 2017-04-02 MED ORDER — IBUPROFEN 400 MG PO TABS: 400.0000 mg | ORAL_TABLET | Freq: Four times a day (QID) | ORAL | 0 refills | Status: DC | PRN

## 2017-04-02 MED ORDER — OXYCODONE-ACETAMINOPHEN 5-325 MG PO TABS
1.0000 | ORAL_TABLET | Freq: Once | ORAL | Status: AC
  Administered 2017-04-03: 1 via ORAL
  Filled 2017-04-02: qty 1

## 2017-04-02 NOTE — Discharge Instructions (Signed)
You were seen today for right great toe pain. This is either cellulitis or gout. Regardless, continue antibiotics. You'll be switched from cephalexin to doxycycline. If you develop fevers or worsening redness you should be reevaluated. Take ibuprofen for pain. Norco will be provided for breakthrough pain. Keep your extremity elevated.

## 2017-04-02 NOTE — ED Provider Notes (Signed)
[facility] [place] DEPT [facility] Provider Note   CSN: [id number] Arrival date & time: 04/02/17  2016     History   Chief Complaint Chief Complaint  Patient presents with  . Insect Bite    HPI Noah Ward is a 66 y.o. male.  HPI  This is a 66 year old male with history of hypertension, hyperlipidemia who presents with right great toe pain. Patient reports that Thursday morning he got up to go the bathroom. He felt fine. He felt like he had a spider web on his right toe. One hour later he woke up with a throbbing right great toe. He had persistent and worsening pain throughout the [place]. He was seen at urgent care on Friday. He was diagnosed with gout versus possible cellulitis. He was started on cephalexin. He reports persistent and worsening pain. Current pain is 10 out of 10. He is not taking anything for pain including over-the-counter. He reports some worsening of the redness of the right great toe. No fevers. Denies other symptoms. Denies history of gout, heavy alcohol use, meat eating.  Past Medical History:  Diagnosis Date  . Bipolar affective disorder ([place])   . Esophageal reflux   . Hyperlipidemia   . Hypertension   . Hypothyroidism   . OSA treated with BiPAP     Patient Active Problem List   Diagnosis Date Noted  . Right knee pain 02/01/2011    Past Surgical History:  Procedure Laterality Date  . CARDIAC CATHETERIZATION  2009  . COLONOSCOPY    . JOINT REPLACEMENT         Home Medications    Prior to Admission medications   Medication Sig Start Date End Date Taking? Authorizing Provider  amLODipine-benazepril (LOTREL) 5-20 MG per capsule Take 1 capsule by mouth daily.  12/09/10   [provider]  celecoxib (CELEBREX) 200 MG capsule Take 200 mg by mouth 2 (two) times daily.      [provider]  cholecalciferol (VITAMIN D) 1000 UNITS tablet Take 1,000 Units by mouth daily.      [provider]  divalproex (DEPAKOTE) 500 MG EC tablet Take 1,000  mg by mouth at bedtime.  01/13/11   [provider]  doxycycline (VIBRAMYCIN) 100 MG capsule Take 1 capsule (100 mg total) by mouth 2 (two) times daily. 04/02/17   Horton, Barbette Hair, MD  enoxaparin (LOVENOX) 30 MG/0.3ML SOLN Inject 30 mg into the skin every 12 (twelve) hours.      [provider]  HYDROcodone-acetaminophen (NORCO/VICODIN) 5-325 MG tablet Take 1 tablet by mouth every 6 (six) hours as needed. 04/02/17   Horton, Barbette Hair, MD  ibuprofen (ADVIL,MOTRIN) 400 MG tablet Take 1 tablet (400 mg total) by mouth every 6 (six) hours as needed. 04/02/17   Horton, Barbette Hair, MD  levothyroxine (SYNTHROID, LEVOTHROID) 25 MCG tablet Take 25 mcg by mouth daily.  01/28/11   [provider]  meclizine (ANTIVERT) 50 MG tablet Take 0.5 tablets (25 mg total) by mouth 3 (three) times daily as needed for dizziness. 05/14/15   Orlie Dakin, MD  Multiple Vitamin (MULTIVITAMIN) tablet Take 1 tablet by mouth daily.      [provider]  oxycodone (OXY-IR) 5 MG capsule Take 5-15 mg by mouth every 3 (three) hours as needed. For pain. Take 1 tab for minor pain; take 2 tabs for moderate pain and 3 tabs for severe pain     [provider]  polysaccharide iron (NIFEREX) 150 MG CAPS capsule Take 150 mg  by mouth daily.      [provider]  simvastatin (ZOCOR) 10 MG tablet Take 10 mg by mouth at bedtime.  01/28/11   [provider]  testosterone enanthate (DELATESTRYL) 200 MG/ML injection Inject 200 mg into the muscle every 14 (fourteen) days. For IM use only     [provider]  traMADol (ULTRAM) 50 MG tablet  11/26/10   [provider]  traMADol (ULTRAM) 50 MG tablet Take 1 tablet (50 mg total) by mouth every 6 (six) hours as needed. 06/21/13   Blanchie Dessert, MD    Family History Family History  Problem Relation Age of Onset  . Hypertension Mother   . Hypertension Father   . Hyperlipidemia Father   . Heart attack Father   .  Hypertension Brother   . Diabetes Maternal Aunt   . Hypertension Maternal Aunt   . Sudden death Neg Hx     Social History Social History  Substance Use Topics  . Smoking status: Never Smoker  . Smokeless tobacco: Not on file  . Alcohol use No     Allergies   12 hour nasal spray [nasal spray]; Crestor [rosuvastatin]; and Sulfa antibiotics   Review of Systems Review of Systems  Constitutional: Negative for fever.  Cardiovascular: Negative for chest pain.  Musculoskeletal:       Right great toe pain and swelling  Skin: Positive for wound.  All other systems reviewed and are negative.    Physical Exam Updated Vital Signs BP 140/90 (BP Location: Right Arm)   Pulse 76   Temp 98.3 F (36.8 C) (Oral)   Resp 20   SpO2 98%   Physical Exam  Constitutional: He is oriented to person, place, and time. He appears well-developed and well-nourished.  Overweight  HENT:  Head: Normocephalic and atraumatic.  Cardiovascular: Normal rate, regular rhythm and normal heart sounds.   Pulmonary/Chest: Effort normal and breath sounds normal. No respiratory distress. He has no wheezes.  Abdominal: Soft. There is no tenderness.  Musculoskeletal: He exhibits edema.  Mild swelling and warmth noted mostly over the right great toe with erythema over the medial aspect of the first digit, mild erythema extending to the mid foot, 2+ DP pulse, decreased range of motion of the toe secondary to pain, no petechiae  Neurological: He is alert and oriented to person, place, and time.  Skin: Skin is warm and dry.  Psychiatric: He has a normal mood and affect.  Nursing note and vitals reviewed.    ED Treatments / Results  Labs (all labs ordered are listed, but only abnormal results are displayed) Labs Reviewed - No data to display  EKG  EKG Interpretation None       Radiology No results found.  Procedures Procedures (including critical care time)  Medications Ordered in ED Medications    ketorolac (TORADOL) 30 MG/ML injection 30 mg (not administered)  oxyCODONE-acetaminophen (PERCOCET/ROXICET) 5-325 MG per tablet 1 tablet (not administered)     Initial Impression / Assessment and Plan / ED Course  I have reviewed the triage vital signs and the nursing notes.  Pertinent labs & imaging results that were available during my care of the patient were reviewed by me and considered in my medical decision making (see chart for details).     Patient presents with right great toe pain. Differential includes cellulitis versus gout. He is nontoxic-appearing. Denies systemic symptoms. Has not been taking anything for pain. Patient was given Percocet and Toradol. He reports increasing  pain and redness since starting antibiotics. It's unclear whether this is truly cellulitis versus gout. Regardless recommend anti-inflammatory medications. Will switch antibiotics from Keflex to doxycycline. Patient was given precautions.  After history, exam, and medical workup I feel the patient has been appropriately medically screened and is safe for discharge home. Pertinent diagnoses were discussed with the patient. Patient was given return precautions.   Final Clinical Impressions(s) / ED Diagnoses   Final diagnoses:  Great toe pain, right  Cellulitis of toe of right foot    New Prescriptions New Prescriptions   DOXYCYCLINE (VIBRAMYCIN) 100 MG CAPSULE    Take 1 capsule (100 mg total) by mouth 2 (two) times daily.   HYDROCODONE-ACETAMINOPHEN (NORCO/VICODIN) 5-325 MG TABLET    Take 1 tablet by mouth every 6 (six) hours as needed.   IBUPROFEN (ADVIL,MOTRIN) 400 MG TABLET    Take 1 tablet (400 mg total) by mouth every 6 (six) hours as needed.     Merryl Hacker, MD 04/02/17 [phone]

## 2017-04-02 NOTE — ED Triage Notes (Signed)
PT presents with c/o of insect bite to right foot great toe on Thursday and went to urgent care Friday and got PO antibiotics but pt thinks it is getting worse. Redness and swelling noted to rt foot great toe.

## 2017-04-03 DIAGNOSIS — M79674 Pain in right toe(s): Secondary | ICD-10-CM | POA: Diagnosis not present

## 2020-12-06 ENCOUNTER — Emergency Department (HOSPITAL_COMMUNITY)
Admission: EM | Admit: 2020-12-06 | Discharge: 2020-12-07 | Disposition: A | Discharge status: Home / Self Care | Payer: Medicare Other | Attending: Emergency Medicine | Admitting: Emergency Medicine

## 2020-12-06 ENCOUNTER — Encounter (HOSPITAL_COMMUNITY): Payer: Self-pay

## 2020-12-06 ENCOUNTER — Other Ambulatory Visit: Payer: Self-pay

## 2020-12-06 DIAGNOSIS — R29898 Other symptoms and signs involving the musculoskeletal system: Secondary | ICD-10-CM

## 2020-12-06 DIAGNOSIS — R202 Paresthesia of skin: Secondary | ICD-10-CM | POA: Diagnosis not present

## 2020-12-06 DIAGNOSIS — M6281 Muscle weakness (generalized): Secondary | ICD-10-CM | POA: Insufficient documentation

## 2020-12-06 DIAGNOSIS — I10 Essential (primary) hypertension: Secondary | ICD-10-CM | POA: Diagnosis not present

## 2020-12-06 DIAGNOSIS — Z79899 Other long term (current) drug therapy: Secondary | ICD-10-CM | POA: Diagnosis not present

## 2020-12-06 DIAGNOSIS — E039 Hypothyroidism, unspecified: Secondary | ICD-10-CM | POA: Insufficient documentation

## 2020-12-06 DIAGNOSIS — M545 Low back pain, unspecified: Secondary | ICD-10-CM | POA: Insufficient documentation

## 2020-12-06 DIAGNOSIS — G8929 Other chronic pain: Secondary | ICD-10-CM | POA: Diagnosis not present

## 2020-12-06 NOTE — ED Triage Notes (Signed)
Patient arrives via EMS from home with complaint of bilateral leg weakness that started around 1600. Pt reports going to church and being able to ambulate normally earlier in the [place]. Around 1700 pt rolled out of bed and was unable to get up, fire dept was called out to assist pt back into bed. Around 2300 after no improvement pt decided to come to the ED. EMS reports pt was able to stand up from bed and turn to sit on stretcher, as well as equal strength in both legs. Hx of bilateral total knee replacement.  EMS vitals: BP 140/90 HR 100 RR 18 SPO2 97% RA CBG 118

## 2020-12-07 NOTE — ED Notes (Signed)
Patient was able to ambulate down the hall and back to bed with minimal assistance. Once pt returned to bed he requested to use the bathroom and was able to ambulate to and from bathroom without assistance. Pt reports his gait has returned to baseline

## 2020-12-07 NOTE — ED Provider Notes (Signed)
[facility] DEPT Provider Note   CSN: [id number] Arrival date & time: 12/06/20  2335     History Chief Complaint  Patient presents with  . Extremity Weakness    Noah Ward is a 70 y.o. male.  70 year old male with a chief complaints of bilateral leg weakness.  This is been going on since he woke up this morning.  He woke up in a chair and felt like his legs were numb and was having significant difficulty walking.  Was able to make it to his bed and lay down and went back to sleep.  When he woke up to go to the bathroom he again felt like his legs were like rubber.  Felt like he had symptoms bilaterally.  Denied any lightheadedness or dizziness.  Denied any arm numbness or weakness.  He does have chronic back pain that he thinks is not significantly changed from baseline.  Denies loss of bowel or bladder denies loss of peritoneal sensation.  He did have a spinal injection done in January but none recently.  Denies recent surgery.  Denies fevers.  Denies trauma.  EMS was called to the patient's house.  He was able to stand and ambulate for them.  He does feel like his legs are much better now.    The history is provided by the patient.  Illness Severity:  Moderate Onset quality:  Gradual Duration:  2 days Timing:  Constant Progression:  Worsening Chronicity:  New Associated symptoms: no abdominal pain, no chest pain, no congestion, no diarrhea, no fever, no headaches, no myalgias, no rash, no shortness of breath and no vomiting        Past Medical History:  Diagnosis Date  . Bipolar affective disorder ([place])   . Esophageal reflux   . Hyperlipidemia   . Hypertension   . Hypothyroidism   . OSA treated with BiPAP     Patient Active Problem List   Diagnosis Date Noted  . Right knee pain 02/01/2011    Past Surgical History:  Procedure Laterality Date  . CARDIAC CATHETERIZATION  2009  . COLONOSCOPY    . JOINT REPLACEMENT    . REPLACEMENT  TOTAL KNEE BILATERAL Bilateral 2015       Family History  Problem Relation Age of Onset  . Hypertension Mother   . Hypertension Father   . Hyperlipidemia Father   . Heart attack Father   . Hypertension Brother   . Diabetes Maternal Aunt   . Hypertension Maternal Aunt   . Sudden death Neg Hx     Social History   Tobacco Use  . Smoking status: Never Smoker  . Smokeless tobacco: Never Used  Vaping Use  . Vaping Use: Never used  Substance Use Topics  . Alcohol use: No  . Drug use: No    Home Medications Prior to Admission medications   Medication Sig Start Date End Date Taking? Authorizing Provider  amLODipine-benazepril (LOTREL) 5-20 MG per capsule Take 1 capsule by mouth daily.  12/09/10   [provider]  celecoxib (CELEBREX) 200 MG capsule Take 200 mg by mouth 2 (two) times daily.      [provider]  cholecalciferol (VITAMIN D) 1000 UNITS tablet Take 1,000 Units by mouth daily.      [provider]  divalproex (DEPAKOTE) 500 MG EC tablet Take 1,000 mg by mouth at bedtime.  01/13/11   [provider]  doxycycline (VIBRAMYCIN) 100 MG capsule Take 1 capsule (100 mg total) by  mouth 2 (two) times daily. 04/02/17   Horton, Barbette Hair, MD  enoxaparin (LOVENOX) 30 MG/0.3ML SOLN Inject 30 mg into the skin every 12 (twelve) hours.      [provider]  HYDROcodone-acetaminophen (NORCO/VICODIN) 5-325 MG tablet Take 1 tablet by mouth every 6 (six) hours as needed. 04/02/17   Horton, Barbette Hair, MD  ibuprofen (ADVIL,MOTRIN) 400 MG tablet Take 1 tablet (400 mg total) by mouth every 6 (six) hours as needed. 04/02/17   Horton, Barbette Hair, MD  levothyroxine (SYNTHROID, LEVOTHROID) 25 MCG tablet Take 25 mcg by mouth daily.  01/28/11   [provider]  meclizine (ANTIVERT) 50 MG tablet Take 0.5 tablets (25 mg total) by mouth 3 (three) times daily as needed for dizziness. 05/14/15   Orlie Dakin, MD  Multiple Vitamin (MULTIVITAMIN) tablet Take  1 tablet by mouth daily.      [provider]  oxycodone (OXY-IR) 5 MG capsule Take 5-15 mg by mouth every 3 (three) hours as needed. For pain. Take 1 tab for minor pain; take 2 tabs for moderate pain and 3 tabs for severe pain     [provider]  polysaccharide iron (NIFEREX) 150 MG CAPS capsule Take 150 mg by mouth daily.      [provider]  simvastatin (ZOCOR) 10 MG tablet Take 10 mg by mouth at bedtime.  01/28/11   [provider]  testosterone enanthate (DELATESTRYL) 200 MG/ML injection Inject 200 mg into the muscle every 14 (fourteen) days. For IM use only     [provider]  traMADol (ULTRAM) 50 MG tablet  11/26/10   [provider]  traMADol (ULTRAM) 50 MG tablet Take 1 tablet (50 mg total) by mouth every 6 (six) hours as needed. 06/21/13   Blanchie Dessert, MD    Allergies    12 hour nasal spray [nasal spray], Crestor [rosuvastatin], and Sulfa antibiotics  Review of Systems   Review of Systems  Constitutional: Negative for chills and fever.  HENT: Negative for congestion and facial swelling.   Eyes: Negative for discharge and visual disturbance.  Respiratory: Negative for shortness of breath.   Cardiovascular: Negative for chest pain and palpitations.  Gastrointestinal: Negative for abdominal pain, diarrhea and vomiting.  Musculoskeletal: Negative for arthralgias and myalgias.  Skin: Negative for color change and rash.  Neurological: Positive for weakness and numbness. Negative for tremors, syncope and headaches.  Psychiatric/Behavioral: Negative for confusion and dysphoric mood.    Physical Exam Updated Vital Signs BP 122/85 (BP Location: Right Arm)   Pulse 88   Temp 98.6 F (37 C) (Oral)   Resp 15   Ht 6\' 2"  (1.88 m)   Wt 127 kg   SpO2 95%   BMI 35.95 kg/m   Physical Exam Vitals and nursing note reviewed.  Constitutional:      Appearance: He is well-developed.  HENT:     Head: Normocephalic and atraumatic.   Eyes:     Pupils: Pupils are equal, round, and reactive to light.  Neck:     Vascular: No JVD.  Cardiovascular:     Rate and Rhythm: Normal rate and regular rhythm.     Heart sounds: No murmur heard. No friction rub. No gallop.   Pulmonary:     Effort: No respiratory distress.     Breath sounds: No wheezing.  Abdominal:     General: There is no distension.     Tenderness: There is no guarding or rebound.  Musculoskeletal:  General: No swelling or tenderness. Normal range of motion.     Cervical back: Normal range of motion and neck supple.     Comments: No tenderness step-offs or deformities of the L-spine.  Pulse motor and sensation intact to bilateral lower extremities.  He has 2 beats of clonus on the right and none on the left.  Reflexes are 2+ and equal bilaterally.  He was able to ambulate on exam.  Skin:    Coloration: Skin is not pale.     Findings: No rash.  Neurological:     Mental Status: He is alert and oriented to person, place, and time.  Psychiatric:        Behavior: Behavior normal.     ED Results / Procedures / Treatments   Labs (all labs ordered are listed, but only abnormal results are displayed) Labs Reviewed - No data to display  EKG None  Radiology No results found.  Procedures Procedures   Medications Ordered in ED Medications - No data to display  ED Course  I have reviewed the triage vital signs and the nursing notes.  Pertinent labs & imaging results that were available during my care of the patient were reviewed by me and considered in my medical decision making (see chart for details).    MDM Rules/Calculators/A&P                          70 yo M with a chief complaints of bilateral leg weakness.  I considered cauda equina as an etiology though it seems unlikely that the patient would have significant improvement of his symptoms spontaneously.  Seems also unlikely of [place] presentation.  Also less likely to be a stroke  with bilateral symptoms.  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  He is able to ambulate here independently without issue.  I discussed different modalities of treatment and evaluation here and after discussion we decided to discharge the patient home and will have him follow-up with his family and spinal doctor.  2:00 AM:  I have discussed the diagnosis/risks/treatment options with the patient and believe the pt to be eligible for discharge home to follow-up with PCP, spine. We also discussed returning to the ED immediately if new or worsening sx occur. We discussed the sx which are most concerning (e.g., sudden worsening pain, fever, inability to tolerate by mouth, cauda equina s/sx) that necessitate immediate return. Medications administered to the patient during their visit and any new prescriptions provided to the patient are listed below.  Medications given during this visit Medications - No data to display   The patient appears reasonably screen and/or stabilized for discharge and I doubt any other medical condition or other [facility] requiring further screening, evaluation, or treatment in the ED at this time prior to discharge.   Final Clinical Impression(s) / ED Diagnoses Final diagnoses:  Transient leg weakness    Rx / DC Orders ED Discharge Orders    None       Deno Etienne, DO 12/07/20 0200

## 2020-12-07 NOTE — Discharge Instructions (Signed)
Please return for worsening weakness, difficulty with urination or bowel movement, numbness to the groin.  Follow up with your doctor.

## 2020-12-08 ENCOUNTER — Emergency Department (HOSPITAL_BASED_OUTPATIENT_CLINIC_OR_DEPARTMENT_OTHER)
Admission: EM | Admit: 2020-12-08 | Discharge: 2020-12-08 | Disposition: A | Discharge status: Home / Self Care | Payer: Medicare Other | Attending: Emergency Medicine | Admitting: Emergency Medicine

## 2020-12-08 ENCOUNTER — Encounter (HOSPITAL_BASED_OUTPATIENT_CLINIC_OR_DEPARTMENT_OTHER): Payer: Self-pay

## 2020-12-08 ENCOUNTER — Other Ambulatory Visit: Payer: Self-pay

## 2020-12-08 DIAGNOSIS — R300 Dysuria: Secondary | ICD-10-CM | POA: Diagnosis present

## 2020-12-08 DIAGNOSIS — I10 Essential (primary) hypertension: Secondary | ICD-10-CM | POA: Diagnosis not present

## 2020-12-08 DIAGNOSIS — Z79899 Other long term (current) drug therapy: Secondary | ICD-10-CM | POA: Diagnosis not present

## 2020-12-08 DIAGNOSIS — B9689 Other specified bacterial agents as the cause of diseases classified elsewhere: Secondary | ICD-10-CM | POA: Diagnosis not present

## 2020-12-08 DIAGNOSIS — E039 Hypothyroidism, unspecified: Secondary | ICD-10-CM | POA: Insufficient documentation

## 2020-12-08 DIAGNOSIS — Z96653 Presence of artificial knee joint, bilateral: Secondary | ICD-10-CM | POA: Insufficient documentation

## 2020-12-08 DIAGNOSIS — N3000 Acute cystitis without hematuria: Secondary | ICD-10-CM | POA: Diagnosis not present

## 2020-12-08 DIAGNOSIS — R29898 Other symptoms and signs involving the musculoskeletal system: Secondary | ICD-10-CM

## 2020-12-08 DIAGNOSIS — M6281 Muscle weakness (generalized): Secondary | ICD-10-CM | POA: Insufficient documentation

## 2020-12-08 LAB — CBC WITH DIFFERENTIAL/PLATELET
Abs Immature Granulocytes: 0.16 10*3/uL — ABNORMAL HIGH (ref 0.00–0.07)
Basophils Absolute: 0.1 10*3/uL (ref 0.0–0.1)
Basophils Relative: 0 %
Eosinophils Absolute: 0 10*3/uL (ref 0.0–0.5)
Eosinophils Relative: 0 %
HCT: 40.8 % (ref 39.0–52.0)
Hemoglobin: 13.3 g/dL (ref 13.0–17.0)
Immature Granulocytes: 1 %
Lymphocytes Relative: 12 %
Lymphs Abs: 2.5 10*3/uL (ref 0.7–4.0)
MCH: 30 pg (ref 26.0–34.0)
MCHC: 32.6 g/dL (ref 30.0–36.0)
MCV: 92.1 fL (ref 80.0–100.0)
Monocytes Absolute: 2.1 10*3/uL — ABNORMAL HIGH (ref 0.1–1.0)
Monocytes Relative: 11 %
Neutro Abs: 15.1 10*3/uL — ABNORMAL HIGH (ref 1.7–7.7)
Neutrophils Relative %: 76 %
Platelets: 209 10*3/uL (ref 150–400)
RBC: 4.43 MIL/uL (ref 4.22–5.81)
RDW: 13.8 % (ref 11.5–15.5)
WBC: 19.9 10*3/uL — ABNORMAL HIGH (ref 4.0–10.5)
nRBC: 0 % (ref 0.0–0.2)

## 2020-12-08 LAB — COMPREHENSIVE METABOLIC PANEL
ALT: 14 U/L (ref 0–44)
AST: 24 U/L (ref 15–41)
Albumin: 3.5 g/dL (ref 3.5–5.0)
Alkaline Phosphatase: 55 U/L (ref 38–126)
Anion gap: 9 (ref 5–15)
BUN: 17 mg/dL (ref 8–23)
CO2: 24 mmol/L (ref 22–32)
Calcium: 9.1 mg/dL (ref 8.9–10.3)
Chloride: 97 mmol/L — ABNORMAL LOW (ref 98–111)
Creatinine, Ser: 1.18 mg/dL (ref 0.61–1.24)
GFR, Estimated: >60 mL/min (ref >60)
Glucose, Bld: 114 mg/dL — ABNORMAL HIGH (ref 70–99)
Potassium: 4 mmol/L (ref 3.5–5.1)
Sodium: 130 mmol/L — ABNORMAL LOW (ref 135–145)
Total Bilirubin: 1 mg/dL (ref 0.3–1.2)
Total Protein: 7.2 g/dL (ref 6.5–8.1)

## 2020-12-08 LAB — URINALYSIS, ROUTINE W REFLEX MICROSCOPIC
Glucose, UA: NEGATIVE mg/dL
Ketones, ur: NEGATIVE mg/dL
Nitrite: POSITIVE — AB
Protein, ur: 30 mg/dL — AB
Specific Gravity, Urine: 1.015 (ref 1.005–1.030)
pH: 6 (ref 5.0–8.0)

## 2020-12-08 LAB — URINALYSIS, MICROSCOPIC (REFLEX): WBC, UA: >50 WBC/hpf (ref 0–5)

## 2020-12-08 LAB — CBG MONITORING, ED: Glucose-Capillary: 113 mg/dL — ABNORMAL HIGH (ref 70–99)

## 2020-12-08 MED ORDER — SODIUM CHLORIDE 0.9 % IV SOLN
1.0000 g | Freq: Once | INTRAVENOUS | Status: AC
  Administered 2020-12-08: 1 g via INTRAVENOUS
  Filled 2020-12-08: qty 10

## 2020-12-08 MED ORDER — CEPHALEXIN 500 MG PO CAPS: 500.0000 mg | ORAL_CAPSULE | Freq: Three times a day (TID) | ORAL | 0 refills | Status: AC

## 2020-12-08 MED ORDER — LACTATED RINGERS IV BOLUS
1000.0000 mL | Freq: Once | INTRAVENOUS | Status: AC
  Administered 2020-12-08: 1000 mL via INTRAVENOUS

## 2020-12-08 NOTE — ED Provider Notes (Signed)
[facility] EMERGENCY DEPARTMENT Provider Note  CSN: [id number] Arrival date & time: 12/08/20 1558    History Chief Complaint  Patient presents with  . Hypotension  . Dysuria    HPI  Noah Ward is a 70 y.o. male presents for re-evaluation of leg weakness. He reports several days ago after sleeping in a recliner watching TV he woke up and his legs felt numb and unable to move them. He eventually made his way to bed using a walker and had a similar sensation during the night when getting up to urinate. He had to call EMS after falling out of bed and not being able to get up. He was seen in the [facility] late in the night of 5/29 and into the early morning of 5/30. After being there for several hours he was able to stand and walk without difficulty and was ultimately discharged home. He has continued to have similar issues since then. He has chronic back pain which is unchanged from baseline. His numbness/weakness is in both legs. He has fallen out of bed a couple of additional times since then requiring EMS help. He reports he was told during one EMS visit that he had 'a little bit of a fever and low BP' but he does not know what those numbers were. He now also reports 4-5 days of dysuria since passing a kidney stone. He did not mention that at his previous ED visit. He called his PCP office this morning and tried to send in a urine specimen with his wife but since it didn't have his name on it, they couldn't run it. He reported to them that he hadn't been out of bed in several days, unable to get to their office for an appointment but was able to use a walker to get into the car for his wife to bring him to the ED. At the time of my evaluation he stated his leg numbness had resolved but they still felt weak.    Past Medical History:  Diagnosis Date  . Bipolar affective disorder ([place])   . Esophageal reflux   . Hyperlipidemia   . Hypertension   . Hypothyroidism   . OSA treated with BiPAP      Past Surgical History:  Procedure Laterality Date  . CARDIAC CATHETERIZATION  2009  . COLONOSCOPY    . JOINT REPLACEMENT    . REPLACEMENT TOTAL KNEE BILATERAL Bilateral 2015    Family History  Problem Relation Age of Onset  . Hypertension Mother   . Hypertension Father   . Hyperlipidemia Father   . Heart attack Father   . Hypertension Brother   . Diabetes Maternal Aunt   . Hypertension Maternal Aunt   . Sudden death Neg Hx     Social History   Tobacco Use  . Smoking status: Never Smoker  . Smokeless tobacco: Never Used  Vaping Use  . Vaping Use: Never used  Substance Use Topics  . Alcohol use: No  . Drug use: No     Home Medications Prior to Admission medications   Medication Sig Start Date End Date Taking? Authorizing Provider  cephALEXin (KEFLEX) 500 MG capsule Take 1 capsule (500 mg total) by mouth 3 (three) times daily for 7 days. 12/08/20 12/15/20 Yes Truddie Hidden, MD  amLODipine-benazepril (LOTREL) 5-20 MG per capsule Take 1 capsule by mouth daily.  12/09/10   [provider]  celecoxib (CELEBREX) 200 MG capsule Take 200 mg by mouth 2 (two)  times daily.      [provider]  cholecalciferol (VITAMIN D) 1000 UNITS tablet Take 1,000 Units by mouth daily.      [provider]  divalproex (DEPAKOTE) 500 MG EC tablet Take 1,000 mg by mouth at bedtime.  01/13/11   [provider]  enoxaparin (LOVENOX) 30 MG/0.3ML SOLN Inject 30 mg into the skin every 12 (twelve) hours.      [provider]  HYDROcodone-acetaminophen (NORCO/VICODIN) 5-325 MG tablet Take 1 tablet by mouth every 6 (six) hours as needed. 04/02/17   Horton, Barbette Hair, MD  ibuprofen (ADVIL,MOTRIN) 400 MG tablet Take 1 tablet (400 mg total) by mouth every 6 (six) hours as needed. 04/02/17   Horton, Barbette Hair, MD  levothyroxine (SYNTHROID, LEVOTHROID) 25 MCG tablet Take 25 mcg by mouth daily.  01/28/11   [provider]  meclizine (ANTIVERT) 50 MG tablet  Take 0.5 tablets (25 mg total) by mouth 3 (three) times daily as needed for dizziness. 05/14/15   Orlie Dakin, MD  Multiple Vitamin (MULTIVITAMIN) tablet Take 1 tablet by mouth daily.      [provider]  oxycodone (OXY-IR) 5 MG capsule Take 5-15 mg by mouth every 3 (three) hours as needed. For pain. Take 1 tab for minor pain; take 2 tabs for moderate pain and 3 tabs for severe pain     [provider]  polysaccharide iron (NIFEREX) 150 MG CAPS capsule Take 150 mg by mouth daily.      [provider]  simvastatin (ZOCOR) 10 MG tablet Take 10 mg by mouth at bedtime.  01/28/11   [provider]  testosterone enanthate (DELATESTRYL) 200 MG/ML injection Inject 200 mg into the muscle every 14 (fourteen) days. For IM use only     [provider]  traMADol (ULTRAM) 50 MG tablet  11/26/10   [provider]  traMADol (ULTRAM) 50 MG tablet Take 1 tablet (50 mg total) by mouth every 6 (six) hours as needed. 06/21/13   Blanchie Dessert, MD     Allergies    12 hour nasal spray [nasal spray], Crestor [rosuvastatin], and Sulfa antibiotics   Review of Systems   Review of Systems A comprehensive review of systems was completed and negative except as noted in HPI.    Physical Exam BP 131/75   Pulse 79   Temp 99 F (37.2 C) (Oral)   Resp 19   Ht 6\' 2"  (1.88 m)   Wt 127 kg   SpO2 97%   BMI 35.95 kg/m   Physical Exam Vitals and nursing note reviewed.  Constitutional:      Appearance: Normal appearance.  HENT:     Head: Normocephalic and atraumatic.     Nose: Nose normal.     Mouth/Throat:     Mouth: Mucous membranes are moist.  Eyes:     Extraocular Movements: Extraocular movements intact.     Conjunctiva/sclera: Conjunctivae normal.  Cardiovascular:     Rate and Rhythm: Normal rate.  Pulmonary:     Effort: Pulmonary effort is normal.     Breath sounds: Normal breath sounds.  Abdominal:     General: Abdomen is flat.      Palpations: Abdomen is soft.     Tenderness: There is no abdominal tenderness.  Musculoskeletal:        General: No swelling. Normal range of motion.     Cervical back: Neck supple.  Skin:    General: Skin is warm and dry.  Neurological:  General: No focal deficit present.     Mental Status: He is alert and oriented to person, place, and time.     Cranial Nerves: No cranial nerve deficit.     Sensory: No sensory deficit.     Motor: No weakness.     Comments: Patient able to lift legs off bed, normal strength proximally and distally. Normal sensation to light touch in BLE. UE neuro exam is also normal.   Psychiatric:        Mood and Affect: Mood normal.      ED Results / Procedures / Treatments   Labs (all labs ordered are listed, but only abnormal results are displayed) Labs Reviewed  COMPREHENSIVE METABOLIC PANEL - Abnormal; Notable for the following components:      Result Value   Sodium 130 (*)    Chloride 97 (*)    Glucose, Bld 114 (*)    All other components within normal limits  CBC WITH DIFFERENTIAL/PLATELET - Abnormal; Notable for the following components:   WBC 19.9 (*)    Neutro Abs 15.1 (*)    Monocytes Absolute 2.1 (*)    Abs Immature Granulocytes 0.16 (*)    All other components within normal limits  URINALYSIS, ROUTINE W REFLEX MICROSCOPIC - Abnormal; Notable for the following components:   Color, Urine ORANGE (*)    APPearance CLOUDY (*)    Hgb urine dipstick MODERATE (*)    Bilirubin Urine SMALL (*)    Protein, ur 30 (*)    Nitrite POSITIVE (*)    Leukocytes,Ua LARGE (*)    All other components within normal limits  URINALYSIS, MICROSCOPIC (REFLEX) - Abnormal; Notable for the following components:   Bacteria, UA MANY (*)    All other components within normal limits  CBG MONITORING, ED - Abnormal; Notable for the following components:   Glucose-Capillary 113 (*)    All other components within normal limits  URINE CULTURE    EKG EKG  Interpretation  Date/Time:  Tuesday Dec 08 2020 16:22:36 EDT Ventricular Rate:  84 PR Interval:    QRS Duration: 118 QT Interval:  379 QTC Calculation: 448 R Axis:   105 Text Interpretation: Atrial fibrillation Nonspecific intraventricular conduction delay Abnormal inferior Q waves Abnormal lateral Q waves Since last tracing Atrial fibrillation has replaced Normal sinus rhythm Confirmed by Calvert Cantor [phone]) on 12/08/2020 4:38:09 PM    Radiology No results found.  Procedures Procedures  Medications Ordered in the ED Medications  lactated ringers bolus 1,000 mL (0 mLs Intravenous Stopped 12/08/20 1823)  cefTRIAXone (ROCEPHIN) 1 g in sodium chloride 0.9 % 100 mL IVPB (0 g Intravenous Stopped 12/08/20 1819)     MDM Rules/Calculators/A&P MDM Will check labs, including UA, give IVF for transient hypotension but normal on recheck. His neuro exam is not consistent with a central cause of leg weakness/numbness. He does not have any objective neuro deficits to suggest stroke, cauda equina, GBS, etc. EKG shows afib which is new from previous in 2016 and not documented in most recent PCP note.   ED Course  I have reviewed the triage vital signs and the nursing notes.  Pertinent labs & imaging results that were available during my care of the patient were reviewed by me and considered in my medical decision making (see chart for details).  Clinical Course as of 12/08/20 1940  Tue Dec 08, 2020  1654 CBC with leukocytosis, otherwise unremarkable. CMP with mild hyponatremia/hypochloremia. Normal otherwise.  [CS]  0865 UA is consistent  with UTI, rocephin ordered, will send for culture.  [CS]  [id number] Patient feeling better after IVF and Rocephin. Discussed admission given his repeated falls and leg weakness but he is adamant that he wants to go home. He was able to walk in the ED without assistance. Will d/c with Keflex and close outpatient PCP follow up.  [CS]    Clinical Course User  Index [CS] Truddie Hidden, MD    Final Clinical Impression(s) / ED Diagnoses Final diagnoses:  Acute cystitis without hematuria  Weakness of both lower extremities    Rx / DC Orders ED Discharge Orders         Ordered    cephALEXin (KEFLEX) 500 MG capsule  3 times daily        12/08/20 1939           Truddie Hidden, MD 12/08/20 1940

## 2020-12-08 NOTE — ED Notes (Signed)
Pt ambulated to BR and back, pt gait steady and denies any dizziness.  Pt given fresh adult brief and paper pants r/t incontinence.  Pt states incontinence at baseline.  Pt denies any pain.

## 2020-12-08 NOTE — ED Notes (Signed)
Pt. Is weak and has not been eating and drinking for the last several days

## 2020-12-08 NOTE — ED Triage Notes (Signed)
Pt having burning with urination as well as frequent urination. Hypotension at home and during triage with fever and dizziness. States difficulty swallowing. Denies chest pain/SOB. PCP wanted him to be seen.   Pt poor historian

## 2020-12-11 LAB — URINE CULTURE: Culture: >=100000 — AB

## 2021-03-24 ENCOUNTER — Encounter (HOSPITAL_COMMUNITY): Payer: Self-pay | Admitting: Emergency Medicine

## 2021-03-24 ENCOUNTER — Other Ambulatory Visit: Payer: Self-pay

## 2021-03-24 ENCOUNTER — Inpatient Hospital Stay (HOSPITAL_COMMUNITY): Payer: No Typology Code available for payment source | Admitting: Certified Registered Nurse Anesthetist

## 2021-03-24 ENCOUNTER — Emergency Department (HOSPITAL_COMMUNITY): Payer: No Typology Code available for payment source

## 2021-03-24 ENCOUNTER — Inpatient Hospital Stay (HOSPITAL_COMMUNITY): Payer: No Typology Code available for payment source

## 2021-03-24 ENCOUNTER — Encounter (HOSPITAL_COMMUNITY)
Admission: EM | Disposition: A | Discharge status: Home / Self Care | Payer: Self-pay | Source: Home / Self Care | Attending: Internal Medicine

## 2021-03-24 ENCOUNTER — Inpatient Hospital Stay (HOSPITAL_COMMUNITY)
Admission: EM | Admit: 2021-03-24 | Discharge: 2021-03-26 | DRG: 853 | Disposition: A | Discharge status: Home / Self Care | Payer: No Typology Code available for payment source | Attending: Internal Medicine | Admitting: Internal Medicine

## 2021-03-24 DIAGNOSIS — Z96659 Presence of unspecified artificial knee joint: Secondary | ICD-10-CM | POA: Diagnosis present

## 2021-03-24 DIAGNOSIS — N390 Urinary tract infection, site not specified: Secondary | ICD-10-CM | POA: Diagnosis present

## 2021-03-24 DIAGNOSIS — A419 Sepsis, unspecified organism: Secondary | ICD-10-CM | POA: Diagnosis present

## 2021-03-24 DIAGNOSIS — R6520 Severe sepsis without septic shock: Secondary | ICD-10-CM | POA: Diagnosis not present

## 2021-03-24 DIAGNOSIS — G250 Essential tremor: Secondary | ICD-10-CM | POA: Diagnosis present

## 2021-03-24 DIAGNOSIS — Z419 Encounter for procedure for purposes other than remedying health state, unspecified: Secondary | ICD-10-CM

## 2021-03-24 DIAGNOSIS — G4730 Sleep apnea, unspecified: Secondary | ICD-10-CM | POA: Diagnosis present

## 2021-03-24 DIAGNOSIS — N201 Calculus of ureter: Secondary | ICD-10-CM | POA: Diagnosis present

## 2021-03-24 DIAGNOSIS — N179 Acute kidney failure, unspecified: Secondary | ICD-10-CM | POA: Diagnosis present

## 2021-03-24 DIAGNOSIS — Y92410 Unspecified street and highway as the place of occurrence of the external cause: Secondary | ICD-10-CM

## 2021-03-24 DIAGNOSIS — E872 Acidosis: Secondary | ICD-10-CM | POA: Diagnosis present

## 2021-03-24 DIAGNOSIS — Z87440 Personal history of urinary (tract) infections: Secondary | ICD-10-CM | POA: Diagnosis not present

## 2021-03-24 DIAGNOSIS — F319 Bipolar disorder, unspecified: Secondary | ICD-10-CM | POA: Diagnosis present

## 2021-03-24 DIAGNOSIS — T1490XA Injury, unspecified, initial encounter: Secondary | ICD-10-CM

## 2021-03-24 DIAGNOSIS — Z882 Allergy status to sulfonamides status: Secondary | ICD-10-CM | POA: Diagnosis not present

## 2021-03-24 DIAGNOSIS — Z888 Allergy status to other drugs, medicaments and biological substances status: Secondary | ICD-10-CM | POA: Diagnosis not present

## 2021-03-24 DIAGNOSIS — S32302A Unspecified fracture of left ilium, initial encounter for closed fracture: Secondary | ICD-10-CM | POA: Diagnosis present

## 2021-03-24 DIAGNOSIS — G9340 Encephalopathy, unspecified: Secondary | ICD-10-CM

## 2021-03-24 DIAGNOSIS — Z20822 Contact with and (suspected) exposure to covid-19: Secondary | ICD-10-CM | POA: Diagnosis present

## 2021-03-24 DIAGNOSIS — G9341 Metabolic encephalopathy: Secondary | ICD-10-CM | POA: Diagnosis present

## 2021-03-24 DIAGNOSIS — N136 Pyonephrosis: Secondary | ICD-10-CM | POA: Diagnosis present

## 2021-03-24 DIAGNOSIS — I10 Essential (primary) hypertension: Secondary | ICD-10-CM | POA: Diagnosis present

## 2021-03-24 HISTORY — DX: Essential (primary) hypertension: I10

## 2021-03-24 HISTORY — PX: CYSTOSCOPY WITH STENT PLACEMENT: SHX5790

## 2021-03-24 HISTORY — DX: Bipolar disorder, unspecified: F31.9

## 2021-03-24 LAB — COMPREHENSIVE METABOLIC PANEL
ALT: 14 U/L (ref 0–44)
ALT: 9 U/L (ref 0–44)
AST: 18 U/L (ref 15–41)
AST: 20 U/L (ref 15–41)
Albumin: 3.6 g/dL (ref 3.5–5.0)
Albumin: 2.8 g/dL — ABNORMAL LOW (ref 3.5–5.0)
Alkaline Phosphatase: 43 U/L (ref 38–126)
Alkaline Phosphatase: 32 U/L — ABNORMAL LOW (ref 38–126)
Anion gap: 13 (ref 5–15)
Anion gap: 8 (ref 5–15)
BUN: 19 mg/dL (ref 8–23)
BUN: 16 mg/dL (ref 8–23)
CO2: 19 mmol/L — ABNORMAL LOW (ref 22–32)
CO2: 24 mmol/L (ref 22–32)
Calcium: 9.6 mg/dL (ref 8.9–10.3)
Calcium: 8.7 mg/dL — ABNORMAL LOW (ref 8.9–10.3)
Chloride: 101 mmol/L (ref 98–111)
Chloride: 103 mmol/L (ref 98–111)
Creatinine, Ser: 1.41 mg/dL — ABNORMAL HIGH (ref 0.61–1.24)
Creatinine, Ser: 1.39 mg/dL — ABNORMAL HIGH (ref 0.61–1.24)
GFR, Estimated: 54 mL/min — ABNORMAL LOW (ref >60)
GFR, Estimated: 55 mL/min — ABNORMAL LOW (ref >60)
Glucose, Bld: 135 mg/dL — ABNORMAL HIGH (ref 70–99)
Glucose, Bld: 130 mg/dL — ABNORMAL HIGH (ref 70–99)
Potassium: 3.8 mmol/L (ref 3.5–5.1)
Potassium: 4 mmol/L (ref 3.5–5.1)
Sodium: 133 mmol/L — ABNORMAL LOW (ref 135–145)
Sodium: 135 mmol/L (ref 135–145)
Total Bilirubin: 0.9 mg/dL (ref 0.3–1.2)
Total Bilirubin: 0.7 mg/dL (ref 0.3–1.2)
Total Protein: 7 g/dL (ref 6.5–8.1)
Total Protein: 5.3 g/dL — ABNORMAL LOW (ref 6.5–8.1)

## 2021-03-24 LAB — CBC WITH DIFFERENTIAL/PLATELET
Abs Immature Granulocytes: 0.14 10*3/uL — ABNORMAL HIGH (ref 0.00–0.07)
Abs Immature Granulocytes: 0.15 10*3/uL — ABNORMAL HIGH (ref 0.00–0.07)
Basophils Absolute: 0.1 10*3/uL (ref 0.0–0.1)
Basophils Absolute: 0.1 10*3/uL (ref 0.0–0.1)
Basophils Relative: 0 %
Basophils Relative: 0 %
Eosinophils Absolute: 0 10*3/uL (ref 0.0–0.5)
Eosinophils Absolute: 0 10*3/uL (ref 0.0–0.5)
Eosinophils Relative: 0 %
Eosinophils Relative: 0 %
HCT: 35.6 % — ABNORMAL LOW (ref 39.0–52.0)
HCT: 44.6 % (ref 39.0–52.0)
Hemoglobin: 12.1 g/dL — ABNORMAL LOW (ref 13.0–17.0)
Hemoglobin: 14.8 g/dL (ref 13.0–17.0)
Immature Granulocytes: 1 %
Immature Granulocytes: 1 %
Lymphocytes Relative: 10 %
Lymphocytes Relative: 6 %
Lymphs Abs: 1.2 10*3/uL (ref 0.7–4.0)
Lymphs Abs: 1.9 10*3/uL (ref 0.7–4.0)
MCH: 30.3 pg (ref 26.0–34.0)
MCH: 30.6 pg (ref 26.0–34.0)
MCHC: 33.2 g/dL (ref 30.0–36.0)
MCHC: 34 g/dL (ref 30.0–36.0)
MCV: 89 fL (ref 80.0–100.0)
MCV: 92.1 fL (ref 80.0–100.0)
Monocytes Absolute: 1.8 10*3/uL — ABNORMAL HIGH (ref 0.1–1.0)
Monocytes Absolute: 2 10*3/uL — ABNORMAL HIGH (ref 0.1–1.0)
Monocytes Relative: 10 %
Monocytes Relative: 9 %
Neutro Abs: 16 10*3/uL — ABNORMAL HIGH (ref 1.7–7.7)
Neutro Abs: 18 10*3/uL — ABNORMAL HIGH (ref 1.7–7.7)
Neutrophils Relative %: 80 %
Neutrophils Relative %: 83 %
Platelets: 208 10*3/uL (ref 150–400)
Platelets: 250 10*3/uL (ref 150–400)
RBC: 4 MIL/uL — ABNORMAL LOW (ref 4.22–5.81)
RBC: 4.84 MIL/uL (ref 4.22–5.81)
RDW: 13.5 % (ref 11.5–15.5)
RDW: 13.5 % (ref 11.5–15.5)
WBC: 19.8 10*3/uL — ABNORMAL HIGH (ref 4.0–10.5)
WBC: 21.4 10*3/uL — ABNORMAL HIGH (ref 4.0–10.5)
nRBC: 0 % (ref 0.0–0.2)
nRBC: 0 % (ref 0.0–0.2)

## 2021-03-24 LAB — URINALYSIS, ROUTINE W REFLEX MICROSCOPIC
Bilirubin Urine: NEGATIVE
Glucose, UA: NEGATIVE mg/dL
Ketones, ur: 15 mg/dL — AB
Nitrite: POSITIVE — AB
Protein, ur: 100 mg/dL — AB
Specific Gravity, Urine: 1.01 (ref 1.005–1.030)
pH: 6 (ref 5.0–8.0)

## 2021-03-24 LAB — LACTIC ACID, PLASMA
Lactic Acid, Venous: 2 mmol/L (ref 0.5–1.9)
Lactic Acid, Venous: 1.4 mmol/L (ref 0.5–1.9)

## 2021-03-24 LAB — HIV ANTIBODY (ROUTINE TESTING W REFLEX): HIV Screen 4th Generation wRfx: NONREACTIVE

## 2021-03-24 LAB — URINALYSIS, MICROSCOPIC (REFLEX)

## 2021-03-24 LAB — PROTIME-INR
INR: 1 (ref 0.8–1.2)
Prothrombin Time: 12.9 seconds (ref 11.4–15.2)

## 2021-03-24 LAB — RESP PANEL BY RT-PCR (FLU A&B, COVID) ARPGX2
Influenza A by PCR: NEGATIVE
Influenza B by PCR: NEGATIVE
SARS Coronavirus 2 by RT PCR: NEGATIVE

## 2021-03-24 LAB — GLUCOSE, CAPILLARY: Glucose-Capillary: 200 mg/dL — ABNORMAL HIGH (ref 70–99)

## 2021-03-24 LAB — APTT: aPTT: 26 seconds (ref 24–36)

## 2021-03-24 SURGERY — CYSTOSCOPY, WITH STENT INSERTION
Anesthesia: General | Site: Ureter | Laterality: Right

## 2021-03-24 MED ORDER — DEXAMETHASONE SODIUM PHOSPHATE 10 MG/ML IJ SOLN
INTRAMUSCULAR | Status: DC | PRN
  Administered 2021-03-24: 10 mg via INTRAVENOUS

## 2021-03-24 MED ORDER — ONDANSETRON HCL 4 MG/2ML IJ SOLN
INTRAMUSCULAR | Status: AC
  Filled 2021-03-24: qty 2

## 2021-03-24 MED ORDER — LACTATED RINGERS IV SOLN: INTRAVENOUS | Status: AC

## 2021-03-24 MED ORDER — CEFTRIAXONE SODIUM 2 G IJ SOLR
2.0000 g | INTRAMUSCULAR | Status: DC
  Administered 2021-03-24 – 2021-03-25 (×3): 2 g via INTRAVENOUS
  Filled 2021-03-24 (×4): qty 20

## 2021-03-24 MED ORDER — IOHEXOL 300 MG/ML  SOLN
INTRAMUSCULAR | Status: DC | PRN
  Administered 2021-03-24: 50 mL via URETHRAL

## 2021-03-24 MED ORDER — PHENYLEPHRINE HCL (PRESSORS) 10 MG/ML IV SOLN
INTRAVENOUS | Status: DC | PRN
  Administered 2021-03-24 (×3): 80 ug via INTRAVENOUS
  Administered 2021-03-24: 40 ug via INTRAVENOUS
  Administered 2021-03-24: 120 ug via INTRAVENOUS

## 2021-03-24 MED ORDER — PROPOFOL 10 MG/ML IV BOLUS
INTRAVENOUS | Status: DC | PRN
  Administered 2021-03-24: 120 mg via INTRAVENOUS

## 2021-03-24 MED ORDER — FENTANYL CITRATE (PF) 250 MCG/5ML IJ SOLN
INTRAMUSCULAR | Status: AC
  Filled 2021-03-24: qty 5

## 2021-03-24 MED ORDER — PHENYLEPHRINE 40 MCG/ML (10ML) SYRINGE FOR IV PUSH (FOR BLOOD PRESSURE SUPPORT)
PREFILLED_SYRINGE | INTRAVENOUS | Status: AC
  Filled 2021-03-24: qty 10

## 2021-03-24 MED ORDER — ALBUMIN HUMAN 5 % IV SOLN: INTRAVENOUS | Status: DC | PRN

## 2021-03-24 MED ORDER — ACETAMINOPHEN 325 MG PO TABS
650.0000 mg | ORAL_TABLET | Freq: Four times a day (QID) | ORAL | Status: DC | PRN
  Filled 2021-03-24: qty 2

## 2021-03-24 MED ORDER — MEPERIDINE HCL 25 MG/ML IJ SOLN: 6.2500 mg | INTRAMUSCULAR | Status: DC | PRN

## 2021-03-24 MED ORDER — DIVALPROEX SODIUM ER 500 MG PO TB24
750.0000 mg | ORAL_TABLET | Freq: Every day | ORAL | Status: DC
  Administered 2021-03-24 – 2021-03-25 (×2): 750 mg via ORAL
  Filled 2021-03-24 (×4): qty 1

## 2021-03-24 MED ORDER — SODIUM CHLORIDE 0.9 % IR SOLN
Status: DC | PRN
  Administered 2021-03-24: 3000 mL

## 2021-03-24 MED ORDER — LACTATED RINGERS IV BOLUS (SEPSIS)
1000.0000 mL | Freq: Once | INTRAVENOUS | Status: AC
  Administered 2021-03-24: 1000 mL via INTRAVENOUS

## 2021-03-24 MED ORDER — FENTANYL CITRATE (PF) 100 MCG/2ML IJ SOLN
INTRAMUSCULAR | Status: DC | PRN
  Administered 2021-03-24: 100 ug via INTRAVENOUS

## 2021-03-24 MED ORDER — PROPOFOL 10 MG/ML IV BOLUS
INTRAVENOUS | Status: AC
  Filled 2021-03-24: qty 20

## 2021-03-24 MED ORDER — WATER FOR IRRIGATION, STERILE IR SOLN
Status: DC | PRN
  Administered 2021-03-24: 1000 mL

## 2021-03-24 MED ORDER — FENTANYL CITRATE (PF) 100 MCG/2ML IJ SOLN: 25.0000 ug | INTRAMUSCULAR | Status: DC | PRN

## 2021-03-24 MED ORDER — ONDANSETRON HCL 4 MG/2ML IJ SOLN
INTRAMUSCULAR | Status: DC | PRN
  Administered 2021-03-24: 4 mg via INTRAVENOUS

## 2021-03-24 MED ORDER — ACETAMINOPHEN 325 MG PO TABS
650.0000 mg | ORAL_TABLET | Freq: Once | ORAL | Status: AC
  Administered 2021-03-24: 650 mg via ORAL
  Filled 2021-03-24: qty 2

## 2021-03-24 MED ORDER — LIDOCAINE HCL (CARDIAC) PF 100 MG/5ML IV SOSY
PREFILLED_SYRINGE | INTRAVENOUS | Status: DC | PRN
  Administered 2021-03-24: 100 mg via INTRAVENOUS

## 2021-03-24 MED ORDER — CHLORHEXIDINE GLUCONATE CLOTH 2 % EX PADS
6.0000 | MEDICATED_PAD | Freq: Every day | CUTANEOUS | Status: DC
  Administered 2021-03-24: 6 via TOPICAL

## 2021-03-24 MED ORDER — SUCCINYLCHOLINE CHLORIDE 200 MG/10ML IV SOSY
PREFILLED_SYRINGE | INTRAVENOUS | Status: DC | PRN
  Administered 2021-03-24: 140 mg via INTRAVENOUS

## 2021-03-24 MED ORDER — IOHEXOL 350 MG/ML SOLN
80.0000 mL | Freq: Once | INTRAVENOUS | Status: AC | PRN
  Administered 2021-03-24: 80 mL via INTRAVENOUS

## 2021-03-24 MED ORDER — PROMETHAZINE HCL 25 MG/ML IJ SOLN: 6.2500 mg | INTRAMUSCULAR | Status: DC | PRN

## 2021-03-24 MED ORDER — ACETAMINOPHEN 650 MG RE SUPP: 650.0000 mg | Freq: Four times a day (QID) | RECTAL | Status: DC | PRN

## 2021-03-24 MED ORDER — LABETALOL HCL 5 MG/ML IV SOLN: 10.0000 mg | INTRAVENOUS | Status: DC | PRN

## 2021-03-24 MED ORDER — PROPRANOLOL HCL 20 MG PO TABS
20.0000 mg | ORAL_TABLET | Freq: Two times a day (BID) | ORAL | Status: DC
  Administered 2021-03-24 – 2021-03-26 (×5): 20 mg via ORAL
  Filled 2021-03-24 (×8): qty 1

## 2021-03-24 SURGICAL SUPPLY — 27 items
BAG URINE DRAIN 2000ML AR STRL (UROLOGICAL SUPPLIES) ×2 IMPLANT
BAG URO CATCHER STRL LF (MISCELLANEOUS) ×2 IMPLANT
CATH FOLEY 2WAY SLVR  5CC 16FR (CATHETERS)
CATH FOLEY 2WAY SLVR 5CC 16FR (CATHETERS) IMPLANT
CATH INTERMIT  6FR 70CM (CATHETERS) ×4 IMPLANT
CNTNR URN SCR LID CUP LEK RST (MISCELLANEOUS) ×1 IMPLANT
CONT SPEC 4OZ STRL OR WHT (MISCELLANEOUS) ×1
GLOVE SURG ENC MOIS LTX SZ7.5 (GLOVE) ×2 IMPLANT
GOWN STRL REUS W/ TWL LRG LVL3 (GOWN DISPOSABLE) ×1 IMPLANT
GOWN STRL REUS W/ TWL XL LVL3 (GOWN DISPOSABLE) ×1 IMPLANT
GOWN STRL REUS W/TWL LRG LVL3 (GOWN DISPOSABLE) ×1
GOWN STRL REUS W/TWL XL LVL3 (GOWN DISPOSABLE) ×1
GUIDEWIRE ANG ZIPWIRE 038X150 (WIRE) IMPLANT
GUIDEWIRE STR DUAL SENSOR (WIRE) ×2 IMPLANT
KIT TURNOVER KIT B (KITS) ×2 IMPLANT
MANIFOLD NEPTUNE II (INSTRUMENTS) ×2 IMPLANT
NS IRRIG 1000ML POUR BTL (IV SOLUTION) IMPLANT
PACK CYSTO (CUSTOM PROCEDURE TRAY) ×2 IMPLANT
SOL PREP POV-IOD 4OZ 10% (MISCELLANEOUS) ×2 IMPLANT
STENT URET 6FRX24 CONTOUR (STENTS) IMPLANT
STENT URET 6FRX26 CONTOUR (STENTS) ×2 IMPLANT
SYPHON OMNI JUG (MISCELLANEOUS) IMPLANT
SYR 10ML LL (SYRINGE) ×2 IMPLANT
SYR 20ML LL LF (SYRINGE) ×2 IMPLANT
TOWEL GREEN STERILE FF (TOWEL DISPOSABLE) ×2 IMPLANT
TUBE CONNECTING 12X1/4 (SUCTIONS) ×2 IMPLANT
WATER STERILE IRR 3000ML UROMA (IV SOLUTION) IMPLANT
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
## 2021-03-24 NOTE — Transfer of Care (Signed)
Immediate Anesthesia Transfer of Care Note  Patient: Noah Ward  Procedure(s) Performed: CYSTOSCOPY WITH STENT PLACEMENT (Right)  Patient Location: PACU  Anesthesia Type:General  Level of Consciousness: drowsy  Airway & Oxygen Therapy: Patient Spontanous Breathing and Patient connected to nasal cannula oxygen  Post-op Assessment: Report given to RN, Post -op Vital signs reviewed and stable and Patient moving all extremities  Post vital signs: Reviewed and stable  Last Vitals:  Vitals Value Taken Time  BP 100/77 03/24/21 0540  Temp 37.2 C 03/24/21 0540  Pulse 112 03/24/21 0543  Resp 19 03/24/21 0543  SpO2 95 % 03/24/21 0543  Vitals shown include unvalidated device data.  Last Pain:  Vitals:   03/24/21 0540  PainSc: 0-No pain         Complications: No notable events documented.
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
## 2021-03-24 NOTE — Progress Notes (Signed)
Received patient from PACU, vitals sign taken, stable patient in bed with call bell in reach and wife at the bedside.

## 2021-03-24 NOTE — Anesthesia Procedure Notes (Signed)
Procedure Name: Intubation Date/Time: 03/24/2021 4:48 AM Performed by: Johndavid Geralds T, CRNA Pre-anesthesia Checklist: Patient identified, Emergency Drugs available, Suction available and Patient being monitored Patient Re-evaluated:Patient Re-evaluated prior to induction Oxygen Delivery Method: Circle system utilized Preoxygenation: Pre-oxygenation with 100% oxygen Induction Type: IV induction, Rapid sequence and Cricoid Pressure applied Ventilation: Mask ventilation without difficulty Laryngoscope Size: Glidescope and 3 Grade View: Grade I Tube type: Oral Tube size: 7.5 mm Number of attempts: 1 Airway Equipment and Method: Stylet and Oral airway Placement Confirmation: ETT inserted through vocal cords under direct vision, positive ETCO2 and breath sounds checked- equal and bilateral Secured at: 23 cm Tube secured with: Tape Dental Injury: Teeth and Oropharynx as per pre-operative assessment  Difficulty Due To: Difficulty was anticipated, Difficult Airway- due to anterior larynx and Difficult Airway- due to limited oral opening Future Recommendations: Recommend- induction with short-acting agent, and alternative techniques readily available
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
## 2021-03-24 NOTE — Anesthesia Preprocedure Evaluation (Signed)
Anesthesia Evaluation  Patient identified by MRN, date of birth, ID band Patient awake    Reviewed: Allergy & Precautions, NPO status , Patient's Chart, lab work & pertinent test results  Airway Mallampati: III  TM Distance: >3 FB Neck ROM: Full    Dental no notable dental hx.    Pulmonary neg pulmonary ROS,    Pulmonary exam normal breath sounds clear to auscultation       Cardiovascular hypertension, negative cardio ROS   Rhythm:Regular Rate:Tachycardia     Neuro/Psych negative neurological ROS     GI/Hepatic negative GI ROS, Neg liver ROS,   Endo/Other  negative endocrine ROS  Renal/GU negative Renal ROS     Musculoskeletal negative musculoskeletal ROS (+)   Abdominal (+) + obese,   Peds  Hematology negative hematology ROS (+)   Anesthesia Other Findings   Reproductive/Obstetrics                             Anesthesia Physical Anesthesia Plan  ASA: 3 and emergent  Anesthesia Plan: General   Post-op Pain Management:    Induction: Intravenous, Rapid sequence and Cricoid pressure planned  PONV Risk Score and Plan: 3 and Ondansetron, Treatment may vary due to age or medical condition and Dexamethasone  Airway Management Planned: Oral ETT  Additional Equipment: None  Intra-op Plan:   Post-operative Plan: Possible Post-op intubation/ventilation  Informed Consent: I have reviewed the patients History and Physical, chart, labs and discussed the procedure including the risks, benefits and alternatives for the proposed anesthesia with the patient or authorized representative who has indicated his/her understanding and acceptance.     Dental advisory given  Plan Discussed with: CRNA  Anesthesia Plan Comments:        Anesthesia Quick Evaluation

## 2021-03-24 NOTE — Op Note (Signed)
Operative Note  Preoperative diagnosis:  1.  Right ureteral calculus with sepsis secondary to UTI  Post operative diagnosis: 1.  Right ureteral calculus with sepsis secondary to UTI  Procedure(s): 1.  Cystoscopy with right retrograde pyelogram and right ureteral stent placement  Surgeon: Link Snuffer, MD  Assistants: None  Anesthesia: General  Complications: None immediate  EBL: Minimal  Specimens: 1.  Urine culture  Drains/Catheters: 1.  6 X 26 double-J ureteral stent 2.  Foley catheter  Intraoperative findings: 1.  Normal urethra with borderline obstructing prostate and bladder 2.  Right retrograde pyelogram revealed a filling defect at the level of the stone with upstream hydroureteronephrosis  Indication: 70 year old male presented with sepsis and found to have a right distal ureteral calculus presents for emergent right ureteral stent placement  Description of procedure:  The patient was identified and consent was obtained.  The patient was taken to the operating room and placed in the supine position.  The patient was placed under general anesthesia.  Perioperative antibiotics were administered.  The patient was placed in dorsal lithotomy.  Patient was prepped and draped in a standard sterile fashion and a timeout was performed.  A 21 French rigid cystoscope was advanced into the urethra and into the bladder.  The right distal most portion of the ureter was cannulated with an open-ended ureteral catheter.  Retrograde pyelogram was performed with the findings noted above.  A sensor wire was then advanced up to the kidney under fluoroscopic guidance.  A 6 X 26 double-J ureteral stent was advanced up to the kidney under fluoroscopic guidance.  The wire was withdrawn and fluoroscopy confirmed good proximal placement and direct visualization confirmed a good coil within the bladder.  Fluid was obtained from the bladder for culture.  Urine was cloudy.  The bladder was drained and  the scope withdrawn.  14 French Foley catheter was placed.  This concluded the operation.  Patient tolerated procedure well and was stable postoperatively.  Plan: Continue IV antibiotics and follow-up cultures.  He will need definitive management of his stone with a ureteroscopy with stone extraction after recovery and UTI/sepsis treatment
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
## 2021-03-24 NOTE — Sepsis Progress Note (Signed)
Monitoring for code sepsis protocol. 
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
## 2021-03-25 ENCOUNTER — Encounter (HOSPITAL_COMMUNITY): Payer: Self-pay | Admitting: Urology

## 2021-03-25 DIAGNOSIS — I10 Essential (primary) hypertension: Secondary | ICD-10-CM | POA: Diagnosis not present

## 2021-03-25 DIAGNOSIS — F319 Bipolar disorder, unspecified: Secondary | ICD-10-CM | POA: Diagnosis not present

## 2021-03-25 DIAGNOSIS — N390 Urinary tract infection, site not specified: Secondary | ICD-10-CM | POA: Diagnosis not present

## 2021-03-25 DIAGNOSIS — A419 Sepsis, unspecified organism: Secondary | ICD-10-CM | POA: Diagnosis not present

## 2021-03-25 LAB — CBC
HCT: 36.6 % — ABNORMAL LOW (ref 39.0–52.0)
Hemoglobin: 11.8 g/dL — ABNORMAL LOW (ref 13.0–17.0)
MCH: 30.2 pg (ref 26.0–34.0)
MCHC: 32.2 g/dL (ref 30.0–36.0)
MCV: 93.6 fL (ref 80.0–100.0)
Platelets: 225 10*3/uL (ref 150–400)
RBC: 3.91 MIL/uL — ABNORMAL LOW (ref 4.22–5.81)
RDW: 14 % (ref 11.5–15.5)
WBC: 29.5 10*3/uL — ABNORMAL HIGH (ref 4.0–10.5)
nRBC: 0 % (ref 0.0–0.2)

## 2021-03-25 LAB — BASIC METABOLIC PANEL
Anion gap: 6 (ref 5–15)
BUN: 18 mg/dL (ref 8–23)
CO2: 28 mmol/L (ref 22–32)
Calcium: 9.3 mg/dL (ref 8.9–10.3)
Chloride: 105 mmol/L (ref 98–111)
Creatinine, Ser: 1.26 mg/dL — ABNORMAL HIGH (ref 0.61–1.24)
GFR, Estimated: >60 mL/min (ref >60)
Glucose, Bld: 105 mg/dL — ABNORMAL HIGH (ref 70–99)
Potassium: 4.2 mmol/L (ref 3.5–5.1)
Sodium: 139 mmol/L (ref 135–145)

## 2021-03-25 NOTE — TOC CAGE-AID Note (Signed)
Transition of Care [facility]) - CAGE-AID Screening   Patient Details  Name: Noah Ward MRN: [id number] Date of Birth: 12-Aug-1950  Transition of Care [facility]) CM/SW Contact:    Noah Ward, [place] Phone Number: 03/25/2021, 9:54 AM   Clinical Narrative: Pt participated in [place].  Pt stated he does not use substance or ETOH.  Pt was not offered resources, due to no usage of substance or ETOH.   Noah Ward, MSW, LCSW-A Pronouns:  She/Her/Hers [facility]itions of Care Clinical Social Worker Direct Number:  [phone] [email or web address]     CAGE-AID Screening:    Have You Ever Felt You Ought to Cut Down on Your Drinking or Drug Use?: No Have Ward Annoyed You By [employer] Your Drinking Or Drug Use?: No Have You Felt Bad Or Guilty About Your Drinking Or Drug Use?: No Have You Ever Had a Drink or Used Drugs First Thing In The Morning to Steady Your Nerves or to Get Rid of a Hangover?: No CAGE-AID Score: 0  Substance Abuse Education Offered: No
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
## 2021-03-25 NOTE — Anesthesia Postprocedure Evaluation (Signed)
Anesthesia Post Note  Patient: Noah Ward  Procedure(s) Performed: CYSTOSCOPY WITH STENT PLACEMENT (Right: Ureter)     Patient location during evaluation: PACU Anesthesia Type: General Level of consciousness: sedated and patient cooperative Pain management: pain level controlled Vital Signs Assessment: post-procedure vital signs reviewed and stable Respiratory status: spontaneous breathing Cardiovascular status: stable Anesthetic complications: no   No notable events documented.  Last Vitals:  Vitals:   03/25/21 0757 03/25/21 1140  BP: 139/75 (!) 141/87  Pulse: (!) 56 71  Resp: 18 17  Temp: 36.9 C 36.8 C  SpO2: 96% 96%    Last Pain:  Vitals:   03/25/21 1140  TempSrc: Oral  PainSc:                  Nolon Nations

## 2021-03-26 DIAGNOSIS — G4730 Sleep apnea, unspecified: Secondary | ICD-10-CM

## 2021-03-26 DIAGNOSIS — A419 Sepsis, unspecified organism: Secondary | ICD-10-CM | POA: Diagnosis not present

## 2021-03-26 DIAGNOSIS — I10 Essential (primary) hypertension: Secondary | ICD-10-CM | POA: Diagnosis not present

## 2021-03-26 DIAGNOSIS — F319 Bipolar disorder, unspecified: Secondary | ICD-10-CM | POA: Diagnosis not present

## 2021-03-26 DIAGNOSIS — N390 Urinary tract infection, site not specified: Secondary | ICD-10-CM | POA: Diagnosis not present

## 2021-03-26 LAB — CBC
HCT: 36.1 % — ABNORMAL LOW (ref 39.0–52.0)
Hemoglobin: 11.8 g/dL — ABNORMAL LOW (ref 13.0–17.0)
MCH: 29.5 pg (ref 26.0–34.0)
MCHC: 32.7 g/dL (ref 30.0–36.0)
MCV: 90.3 fL (ref 80.0–100.0)
Platelets: 229 10*3/uL (ref 150–400)
RBC: 4 MIL/uL — ABNORMAL LOW (ref 4.22–5.81)
RDW: 13.6 % (ref 11.5–15.5)
WBC: 18.3 10*3/uL — ABNORMAL HIGH (ref 4.0–10.5)
nRBC: 0 % (ref 0.0–0.2)

## 2021-03-26 LAB — BODY FLUID CULTURE W GRAM STAIN

## 2021-03-26 MED ORDER — CEPHALEXIN 500 MG PO CAPS: 500.0000 mg | ORAL_CAPSULE | Freq: Four times a day (QID) | ORAL | 0 refills | Status: AC

## 2021-03-26 MED ORDER — ACETAMINOPHEN 325 MG PO TABS: 650.0000 mg | ORAL_TABLET | Freq: Four times a day (QID) | ORAL | Status: AC | PRN

## 2021-03-27 LAB — URINE CULTURE: Culture: >=100000 — AB

## 2021-03-28 NOTE — Discharge Summary (Signed)
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   Specimen: Fluid  Result Value Ref Range Status   Specimen Description FLUID BLADDER  Final   Special Requests NONE  Final   Gram Stain   Final    RARE WBC PRESENT, PREDOMINANTLY PMN NO ORGANISMS SEEN Performed at [facility], [place] [address], [place], [place] [address]    Culture FEW ESCHERICHIA COLI  Final   Report Status 03/26/2021 FINAL  Final   Organism ID, Bacteria ESCHERICHIA COLI  Final      Susceptibility   Escherichia coli - MIC*    AMPICILLIN >=32 RESISTANT Resistant      CEFAZOLIN <=4 SENSITIVE Sensitive     CEFEPIME <=0.12 SENSITIVE Sensitive     CEFTAZIDIME <=1 SENSITIVE Sensitive     CEFTRIAXONE <=0.25 SENSITIVE Sensitive     CIPROFLOXACIN <=0.25 SENSITIVE Sensitive     GENTAMICIN <=1 SENSITIVE Sensitive     IMIPENEM <=0.25 SENSITIVE Sensitive     TRIMETH/SULFA <=20 SENSITIVE Sensitive     AMPICILLIN/SULBACTAM 16 INTERMEDIATE Intermediate     PIP/TAZO <=4 SENSITIVE Sensitive     * FEW ESCHERICHIA COLI     Time coordinating discharge in minutes: 65  SIGNED:   Debbe Odea, MD  [facility] 03/28/2021, 1:58 PM

## 2021-03-29 ENCOUNTER — Encounter (HOSPITAL_BASED_OUTPATIENT_CLINIC_OR_DEPARTMENT_OTHER): Payer: Self-pay

## 2021-03-29 LAB — CULTURE, BLOOD (SINGLE)
Culture: NO GROWTH
Culture: NO GROWTH
Special Requests: ADEQUATE
Special Requests: ADEQUATE

## 2021-03-31 ENCOUNTER — Other Ambulatory Visit: Payer: Self-pay | Admitting: Urology

## 2021-04-06 NOTE — Patient Instructions (Signed)
DUE TO COVID-19 ONLY ONE VISITOR IS ALLOWED TO COME WITH YOU AND STAY IN THE WAITING ROOM ONLY DURING PRE OP AND PROCEDURE.   **NO VISITORS ARE ALLOWED IN THE SHORT STAY AREA OR RECOVERY ROOM!!**  IF YOU WILL BE ADMITTED INTO THE HOSPITAL YOU ARE ALLOWED ONLY TWO SUPPORT PEOPLE DURING VISITATION HOURS ONLY (10AM -8PM)   The support person(s) may change daily. The support person(s) must pass our screening, gel in and out, and wear a mask at all times, including in the patient's room. Patients must also wear a mask when staff or their support person are in the room.  No visitors under the age of 56. Any visitor under the age of 51 must be accompanied by an adult.     Your procedure is scheduled on: 04/12/21   Report to [facility] Main Entrance    Report to admitting at : 10:45 AM   Call this number if you have problems the morning of surgery [phone]   Do not eat food :After Midnight.   May have liquids until : 10:00 AM.   [place] of surgery  CLEAR LIQUID DIET  Foods Allowed                                                                     Foods Excluded  Water, Black Coffee and tea, regular and decaf                             liquids that you cannot  Plain Jell-O in any flavor  (No red)                                           see through such as: Fruit ices (not with fruit pulp)                                     milk, soups, orange juice              Iced Popsicles (No red)                                    All solid food                                   Apple juices Sports drinks like Gatorade (No red) Lightly seasoned clear broth or consume(fat free) Sugar, Sample Menu Breakfast                                Lunch                                     Supper Cranberry juice  Beef broth                            Chicken broth Jell-O                                     Grape juice                           Apple juice Coffee or tea                         Jell-O                                      Popsicle                                                Coffee or tea                        Coffee or tea     Oral Hygiene is also important to reduce your risk of infection.                                    Remember - BRUSH YOUR TEETH THE MORNING OF SURGERY WITH YOUR REGULAR TOOTHPASTE   Do NOT smoke after Midnight   Take these medicines the morning of surgery with A SIP OF WATER: amlodipine,propranolol.  DO NOT TAKE ANY ORAL DIABETIC MEDICATIONS [place] OF YOUR SURGERY                              You may not have any metal on your body including hair pins, jewelry, and body piercing             Do not wear lotions, powders, perfumes/cologne, or deodorant              Men may shave face and neck.   Do not bring valuables to the hospital. [place].   Contacts, dentures or bridgework may not be worn into surgery.   Bring small overnight bag [place] of surgery.    Patients discharged on the [place] of surgery will not be allowed to drive home.   Special Instructions: Bring a copy of your healthcare power of attorney and living will documents         the [place] of surgery if you haven't scanned them before.              Please read over the following fact sheets you were given: IF YOU HAVE [place] [phone]  [place] - Preparing for Surgery Before surgery, you can play an important role.  Because skin is not sterile, your skin needs to be as free of germs as possible.  You can reduce the number of germs on your skin by washing with CHG (chlorahexidine gluconate) soap before surgery.  CHG is an antiseptic cleaner which kills germs and bonds with the skin to continue killing germs even after washing. Please DO NOT use if you have an allergy to CHG or antibacterial soaps.  If your skin  becomes reddened/irritated stop using the CHG and inform your nurse when you arrive at Short Stay. Do not shave (including legs and underarms) for at least 48 hours prior to the first CHG shower.  You may shave your face/neck. Please follow these instructions carefully:  1.  Shower with CHG Soap the night before surgery and the  morning of Surgery.  2.  If you choose to wash your hair, wash your hair first as usual with your  normal  shampoo.  3.  After you shampoo, rinse your hair and body thoroughly to remove the  shampoo.                           4.  Use CHG as you would any other liquid soap.  You can apply chg directly  to the skin and wash                       Gently with a scrungie or clean washcloth.  5.  Apply the CHG Soap to your body ONLY FROM THE NECK DOWN.   Do not use on face/ open                           Wound or open sores. Avoid contact with eyes, ears mouth and genitals (private parts).                       Wash face,  Genitals (private parts) with your normal soap.             6.  Wash thoroughly, paying special attention to the area where your surgery  will be performed.  7.  Thoroughly rinse your body with warm water from the neck down.  8.  DO NOT shower/wash with your normal soap after using and rinsing off  the CHG Soap.                9.  Pat yourself dry with a clean towel.            10.  Wear clean pajamas.            11.  Place clean sheets on your bed the night of your first shower and do not  sleep with pets. [place] of Surgery : Do not apply any lotions/deodorants the morning of surgery.  Please wear clean clothes to the hospital/surgery center.  FAILURE TO FOLLOW THESE INSTRUCTIONS MAY RESULT IN THE CANCELLATION OF YOUR SURGERY PATIENT SIGNATURE_________________________________  NURSE SIGNATURE__________________________________  ________________________________________________________________________

## 2021-04-07 ENCOUNTER — Other Ambulatory Visit: Payer: Self-pay

## 2021-04-07 ENCOUNTER — Encounter (HOSPITAL_COMMUNITY)
Admission: RE | Admit: 2021-04-07 | Discharge: 2021-04-07 | Disposition: A | Discharge status: Home / Self Care | Payer: Medicare Other | Source: Ambulatory Visit | Attending: Urology | Admitting: Urology

## 2021-04-07 ENCOUNTER — Encounter (HOSPITAL_COMMUNITY): Payer: Self-pay

## 2021-04-07 DIAGNOSIS — Z01812 Encounter for preprocedural laboratory examination: Secondary | ICD-10-CM | POA: Diagnosis not present

## 2021-04-07 DIAGNOSIS — G4733 Obstructive sleep apnea (adult) (pediatric): Secondary | ICD-10-CM | POA: Diagnosis not present

## 2021-04-07 DIAGNOSIS — I10 Essential (primary) hypertension: Secondary | ICD-10-CM | POA: Insufficient documentation

## 2021-04-07 DIAGNOSIS — Z79899 Other long term (current) drug therapy: Secondary | ICD-10-CM | POA: Diagnosis not present

## 2021-04-07 DIAGNOSIS — N201 Calculus of ureter: Secondary | ICD-10-CM | POA: Diagnosis not present

## 2021-04-07 DIAGNOSIS — Z7901 Long term (current) use of anticoagulants: Secondary | ICD-10-CM | POA: Insufficient documentation

## 2021-04-07 DIAGNOSIS — I340 Nonrheumatic mitral (valve) insufficiency: Secondary | ICD-10-CM | POA: Insufficient documentation

## 2021-04-07 HISTORY — DX: Personal history of urinary calculi: Z87.442

## 2021-04-07 HISTORY — DX: Other complications of anesthesia, initial encounter: T88.59XA

## 2021-04-07 HISTORY — DX: Malignant (primary) neoplasm, unspecified: C80.1

## 2021-04-07 HISTORY — DX: Cardiac murmur, unspecified: R01.1

## 2021-04-07 NOTE — Progress Notes (Signed)
Anesthesia Chart Review   Case: [id number] Date/Time: 04/12/21 1240   Procedure: RIGHT URETEROSCOPY/HOLMIUM LASER/STENT PLACEMENT (Right)   Anesthesia type: General   Pre-op diagnosis: RIGHT  URETERAL STONE   Location: [facility] ROOM 08 / [facility]   Surgeons: Lucas Mallow, MD       DISCUSSION:70 y.o. never smoker with h/o HTN, bipolar disorder, OSA, hypothyroidism, moderate to severe mitral regurgitation, right ureteral stone scheduled for above procedure 04/12/2021 with Dr. Link Snuffer.   S/p cystoscopy 03/24/2021 with no anesthesia complications noted.  VS: BP 110/82   Pulse 94   Temp 37.1 C (Oral)   Ht 5\' 2"  (1.575 m)   Wt 121.6 kg   SpO2 97%   BMI 49.02 kg/m   PROVIDERS: Jefm Petty, MD is PCP    LABS: Labs reviewed: Acceptable for surgery. (all labs ordered are listed, but only abnormal results are displayed)  Labs Reviewed - No data to display   IMAGES:   EKG:   CV: Echo 12/05/2018 Summary  Mild concentric left ventricular hypertrophy.  Normal left ventricular size and systolic function with no appreciable  segmental abnormality.  Ejection fraction is visually estimated at 60-65%.  Diastolic function is normal  Moderate to severe mitral regurgitation.  There is mild aortic sclerosis noted, with no evidence of stenosis.  No significant change compared to echo 03/2018  Past Medical History:  Diagnosis Date   Bipolar affective disorder ([place])    Bipolar disorder ([place])    Cancer ([place])    Complication of anesthesia    Esophageal reflux    Heart murmur    History of kidney stones    Hyperlipidemia    Hypertension    Hypothyroidism    OSA treated with BiPAP     Past Surgical History:  Procedure Laterality Date   CARDIAC CATHETERIZATION  2009   COLONOSCOPY     CYSTOSCOPY WITH STENT PLACEMENT Right 03/24/2021   Procedure: CYSTOSCOPY WITH STENT PLACEMENT;  Surgeon: Lucas Mallow, MD;  Location: [place];  Service: Urology;  Laterality: Right;   JOINT  REPLACEMENT     partial thyroidectomy     REPLACEMENT TOTAL KNEE     REPLACEMENT TOTAL KNEE BILATERAL Bilateral 2015    MEDICATIONS:  acetaminophen (TYLENOL) 325 MG tablet   amLODipine-benazepril (LOTREL) 5-20 MG capsule   Cholecalciferol (VITAMIN D3) 125 MCG (5000 UT) TABS   divalproex (DEPAKOTE ER) 250 MG 24 hr tablet   ibuprofen (ADVIL) 200 MG tablet   oxycodone (OXY-IR) 5 MG capsule   pravastatin (PRAVACHOL) 20 MG tablet   propranolol (INDERAL) 20 MG tablet   No current facility-administered medications for this encounter.     Noah Felix Ward, PA-C [facility]Surgical Testing [phone]

## 2021-04-07 NOTE — Progress Notes (Addendum)
COVID Vaccine Completed: Yes Date COVID Vaccine completed: 03/2020. X 3 COVID vaccine manufacturer: Pfizer     COVID Test: N/A  PCP - Dr. Jefm Petty Cardiologist - Dr. Abran Richard. LOV: 11/13/18 CEW  Chest x-ray -  EKG - 03/26/21 Stress Test -  ECHO - 03/27/18 Cardiac Cath -  Pacemaker/ICD device last checked:  Sleep Study - Yes CPAP - Yes  Fasting Blood Sugar -  Checks Blood Sugar _____ times a Such  Blood Thinner Instructions: Aspirin Instructions: Last Dose:  Anesthesia review: HTN,OSA(CPAP),Heart murmur  Patient denies shortness of breath, fever, cough and chest pain at PAT appointment   Patient verbalized understanding of instructions that were given to them at the PAT appointment. Patient was also instructed that they will need to review over the PAT instructions again at home before surgery.

## 2021-04-12 ENCOUNTER — Ambulatory Visit (HOSPITAL_COMMUNITY)
Admission: RE | Admit: 2021-04-12 | Discharge: 2021-04-12 | Disposition: A | Discharge status: Home / Self Care | Payer: Medicare Other | Attending: Urology | Admitting: Urology

## 2021-04-12 ENCOUNTER — Encounter (HOSPITAL_COMMUNITY)
Admission: RE | Disposition: A | Discharge status: Home / Self Care | Payer: Self-pay | Source: Home / Self Care | Attending: Urology

## 2021-04-12 ENCOUNTER — Encounter (HOSPITAL_COMMUNITY): Payer: Self-pay | Admitting: Urology

## 2021-04-12 ENCOUNTER — Ambulatory Visit (HOSPITAL_COMMUNITY): Payer: Medicare Other

## 2021-04-12 ENCOUNTER — Ambulatory Visit (HOSPITAL_COMMUNITY): Payer: Medicare Other | Admitting: Anesthesiology

## 2021-04-12 ENCOUNTER — Ambulatory Visit (HOSPITAL_COMMUNITY): Payer: Medicare Other | Admitting: Physician Assistant

## 2021-04-12 DIAGNOSIS — Z833 Family history of diabetes mellitus: Secondary | ICD-10-CM | POA: Diagnosis not present

## 2021-04-12 DIAGNOSIS — R4182 Altered mental status, unspecified: Secondary | ICD-10-CM | POA: Insufficient documentation

## 2021-04-12 DIAGNOSIS — N201 Calculus of ureter: Secondary | ICD-10-CM | POA: Diagnosis present

## 2021-04-12 DIAGNOSIS — Z87440 Personal history of urinary (tract) infections: Secondary | ICD-10-CM | POA: Diagnosis not present

## 2021-04-12 HISTORY — PX: CYSTOSCOPY/URETEROSCOPY/HOLMIUM LASER/STENT PLACEMENT: SHX6546

## 2021-04-12 SURGERY — CYSTOSCOPY/URETEROSCOPY/HOLMIUM LASER/STENT PLACEMENT
Anesthesia: General | Laterality: Right

## 2021-04-12 MED ORDER — PHENYLEPHRINE 40 MCG/ML (10ML) SYRINGE FOR IV PUSH (FOR BLOOD PRESSURE SUPPORT)
PREFILLED_SYRINGE | INTRAVENOUS | Status: AC
  Filled 2021-04-12: qty 10

## 2021-04-12 MED ORDER — PROPOFOL 10 MG/ML IV BOLUS
INTRAVENOUS | Status: DC | PRN
  Administered 2021-04-12: 100 mg via INTRAVENOUS
  Administered 2021-04-12: 150 mg via INTRAVENOUS
  Administered 2021-04-12: 50 mg via INTRAVENOUS

## 2021-04-12 MED ORDER — SODIUM CHLORIDE 0.9 % IR SOLN
Status: DC | PRN
  Administered 2021-04-12: 3000 mL

## 2021-04-12 MED ORDER — ONDANSETRON HCL 4 MG/2ML IJ SOLN
INTRAMUSCULAR | Status: AC
  Filled 2021-04-12: qty 2

## 2021-04-12 MED ORDER — PROPOFOL 10 MG/ML IV BOLUS
INTRAVENOUS | Status: AC
  Filled 2021-04-12: qty 20

## 2021-04-12 MED ORDER — CHLORHEXIDINE GLUCONATE 0.12 % MT SOLN
15.0000 mL | Freq: Once | OROMUCOSAL | Status: AC
  Administered 2021-04-12: 15 mL via OROMUCOSAL

## 2021-04-12 MED ORDER — OXYCODONE HCL 5 MG/5ML PO SOLN: 5.0000 mg | Freq: Once | ORAL | Status: DC | PRN

## 2021-04-12 MED ORDER — PHENYLEPHRINE 40 MCG/ML (10ML) SYRINGE FOR IV PUSH (FOR BLOOD PRESSURE SUPPORT)
PREFILLED_SYRINGE | INTRAVENOUS | Status: DC | PRN
  Administered 2021-04-12: 120 ug via INTRAVENOUS

## 2021-04-12 MED ORDER — MIDAZOLAM HCL 2 MG/2ML IJ SOLN
INTRAMUSCULAR | Status: AC
  Filled 2021-04-12: qty 2

## 2021-04-12 MED ORDER — CEFAZOLIN IN SODIUM CHLORIDE 3-0.9 GM/100ML-% IV SOLN
3.0000 g | INTRAVENOUS | Status: AC
  Administered 2021-04-12: 3 g via INTRAVENOUS
  Filled 2021-04-12: qty 100

## 2021-04-12 MED ORDER — DEXAMETHASONE SODIUM PHOSPHATE 10 MG/ML IJ SOLN
INTRAMUSCULAR | Status: DC | PRN
  Administered 2021-04-12: 10 mg via INTRAVENOUS

## 2021-04-12 MED ORDER — ORAL CARE MOUTH RINSE: 15.0000 mL | Freq: Once | OROMUCOSAL | Status: AC

## 2021-04-12 MED ORDER — IOHEXOL 300 MG/ML  SOLN
INTRAMUSCULAR | Status: DC | PRN
  Administered 2021-04-12: 10 mL

## 2021-04-12 MED ORDER — LIDOCAINE 2% (20 MG/ML) 5 ML SYRINGE
INTRAMUSCULAR | Status: DC | PRN
  Administered 2021-04-12: 80 mg via INTRAVENOUS

## 2021-04-12 MED ORDER — ONDANSETRON HCL 4 MG/2ML IJ SOLN
INTRAMUSCULAR | Status: DC | PRN
  Administered 2021-04-12: 4 mg via INTRAVENOUS

## 2021-04-12 MED ORDER — DEXAMETHASONE SODIUM PHOSPHATE 10 MG/ML IJ SOLN
INTRAMUSCULAR | Status: AC
  Filled 2021-04-12: qty 1

## 2021-04-12 MED ORDER — SUCCINYLCHOLINE CHLORIDE 200 MG/10ML IV SOSY
PREFILLED_SYRINGE | INTRAVENOUS | Status: AC
  Filled 2021-04-12: qty 10

## 2021-04-12 MED ORDER — FENTANYL CITRATE (PF) 100 MCG/2ML IJ SOLN
INTRAMUSCULAR | Status: AC
  Filled 2021-04-12: qty 2

## 2021-04-12 MED ORDER — OXYCODONE HCL 5 MG PO TABS: 5.0000 mg | ORAL_TABLET | Freq: Once | ORAL | Status: DC | PRN

## 2021-04-12 MED ORDER — MIDAZOLAM HCL 5 MG/5ML IJ SOLN
INTRAMUSCULAR | Status: DC | PRN
  Administered 2021-04-12: 2 mg via INTRAVENOUS

## 2021-04-12 MED ORDER — SUCCINYLCHOLINE CHLORIDE 200 MG/10ML IV SOSY
PREFILLED_SYRINGE | INTRAVENOUS | Status: DC | PRN
  Administered 2021-04-12: 120 mg via INTRAVENOUS

## 2021-04-12 MED ORDER — FENTANYL CITRATE PF 50 MCG/ML IJ SOSY: 25.0000 ug | PREFILLED_SYRINGE | INTRAMUSCULAR | Status: DC | PRN

## 2021-04-12 MED ORDER — OXYCODONE HCL 5 MG PO CAPS: 5.0000 mg | ORAL_CAPSULE | ORAL | 0 refills | Status: AC | PRN

## 2021-04-12 MED ORDER — LACTATED RINGERS IV SOLN: INTRAVENOUS | Status: DC

## 2021-04-12 MED ORDER — LIDOCAINE HCL (PF) 2 % IJ SOLN
INTRAMUSCULAR | Status: AC
  Filled 2021-04-12: qty 5

## 2021-04-12 MED ORDER — ONDANSETRON HCL 4 MG/2ML IJ SOLN: 4.0000 mg | Freq: Once | INTRAMUSCULAR | Status: DC | PRN

## 2021-04-12 SURGICAL SUPPLY — 21 items
BAG URO CATCHER STRL LF (MISCELLANEOUS) ×2 IMPLANT
BASKET LASER NITINOL 1.9FR (BASKET) IMPLANT
BASKET ZERO TIP NITINOL 2.4FR (BASKET) ×2 IMPLANT
CATH INTERMIT  6FR 70CM (CATHETERS) ×2 IMPLANT
CLOTH BEACON ORANGE TIMEOUT ST (SAFETY) ×2 IMPLANT
EXTRACTOR STONE 1.7FRX115CM (UROLOGICAL SUPPLIES) IMPLANT
GLOVE SURG ENC MOIS LTX SZ7.5 (GLOVE) ×2 IMPLANT
GOWN STRL REUS W/TWL XL LVL3 (GOWN DISPOSABLE) ×2 IMPLANT
GUIDEWIRE ANG ZIPWIRE 038X150 (WIRE) IMPLANT
GUIDEWIRE STR DUAL SENSOR (WIRE) ×2 IMPLANT
KIT TURNOVER KIT A (KITS) ×2 IMPLANT
LASER FIB FLEXIVA PULSE ID 365 (Laser) IMPLANT
MANIFOLD NEPTUNE II (INSTRUMENTS) ×2 IMPLANT
PACK CYSTO (CUSTOM PROCEDURE TRAY) ×2 IMPLANT
SHEATH URETERAL 12FRX28CM (UROLOGICAL SUPPLIES) IMPLANT
SHEATH URETERAL 12FRX35CM (MISCELLANEOUS) IMPLANT
STENT URET 6FRX26 CONTOUR (STENTS) ×2 IMPLANT
TRACTIP FLEXIVA PULS ID 200XHI (Laser) IMPLANT
TRACTIP FLEXIVA PULSE ID 200 (Laser)
TUBING CONNECTING 10 (TUBING) ×2 IMPLANT
TUBING UROLOGY SET (TUBING) ×2 IMPLANT

## 2021-04-12 NOTE — Op Note (Signed)
Operative Note  Preoperative diagnosis:  1.  Right ureteral stone  Postoperative diagnosis: 1.  Right ureteral stone  Procedure(s): 1.  Cystoscopy with right retrograde pyelogram, right ureteroscopy with stone extraction, right ureteral stent replacement  Surgeon: Link Snuffer, MD  Assistants: None  Anesthesia: General  Complications: None immediate  EBL: Minimal  Specimens: 1.  Renal calculus  Drains/Catheters: 1.  6 x 26 double-J ureteral stent  Intraoperative findings: 1.  Normal anterior urethra 2.  Borderline obstructing prostate 3.  Normal bladder mucosa 4.  5 mm distal right ureteral calculus atraumatically basket extracted.  Retrograde pyelogram revealed no filling defect after removal of the stone with no hydronephrosis.  Indication: 70 year old male status post urgent ureteral stent placement for an obstructing right ureteral stone with sepsis.  He presents for definitive management of the stone.  Description of procedure:  The patient was identified and consent was obtained.  The patient was taken to the operating room and placed in the supine position.  The patient was placed under general anesthesia.  Perioperative antibiotics were administered.  The patient was placed in dorsal lithotomy.  Patient was prepped and draped in a standard sterile fashion and a timeout was performed.  A 21 French rigid cystoscope was advanced into the urethra and into the bladder.  Complete cystoscopy was performed with no abnormal findings.  The right ureteral stent was pulled just beyond the urethral meatus.  A wire was advanced through the stent into the kidney and the stent was withdrawn.  The wire was secured to the drape.  Semirigid ureteroscopy was performed up to the stone of interest which was basket extracted and collected.  I readvanced the scope up the ureter and up to the renal pelvis and there were no other ureteral calculi.  I shot a retrograde pyelogram through the scope with  findings noted above.  I withdrew the scope and backloaded the wire onto the rigid cystoscope followed by routine placement of a 6 x 26 double-J ureteral stent.  Fluoroscopy confirmed proximal placement and direct visualization confirmed a good coil within the bladder.  I drained the bladder and withdrew the scope.  Patient tolerated the procedure well was stable postoperative.  Plan: Follow-up in 5 to 7 days for stent removal

## 2021-04-12 NOTE — Interval H&P Note (Signed)
History and Physical Interval Note:  04/12/2021 12:24 PM  Noah Ward  has presented today for surgery, with the diagnosis of RIGHT  URETERAL STONE.  The various methods of treatment have been discussed with the patient and family. After consideration of risks, benefits and other options for treatment, the patient has consented to  Procedure(s): RIGHT URETEROSCOPY/HOLMIUM LASER/STENT PLACEMENT (Right) as a surgical intervention.  The patient's history has been reviewed, patient examined, no change in status, stable for surgery.  I have reviewed the patient's chart and labs.  Questions were answered to the patient's satisfaction.     Marton Redwood, III

## 2021-04-12 NOTE — Transfer of Care (Signed)
Immediate Anesthesia Transfer of Care Note  Patient: Noah Ward  Procedure(s) Performed: CYSTOSCOPY, RIGHT URETEROSCOPY, STENT EXCHANGE (Right)  Patient Location: PACU  Anesthesia Type:General  Level of Consciousness: drowsy  Airway & Oxygen Therapy: Patient Spontanous Breathing and Patient connected to face mask oxygen  Post-op Assessment: Report given to RN and Post -op Vital signs reviewed and stable  Post vital signs: Reviewed and stable  Last Vitals:  Vitals Value Taken Time  BP 150/81 04/12/21 1537  Temp    Pulse 73 04/12/21 1539  Resp 12 04/12/21 1539  SpO2 100 % 04/12/21 1539  Vitals shown include unvalidated device data.  Last Pain:  Vitals:   04/12/21 1306  TempSrc: Oral  PainSc: 0-No pain         Complications: No notable events documented.

## 2021-04-12 NOTE — Anesthesia Procedure Notes (Addendum)
Procedure Name: Intubation Date/Time: 04/12/2021 3:03 PM Performed by: Sharlette Dense, CRNA Patient Re-evaluated:Patient Re-evaluated prior to induction Oxygen Delivery Method: Circle system utilized Preoxygenation: Pre-oxygenation with 100% oxygen Induction Type: IV induction Ventilation: Mask ventilation without difficulty and Oral airway inserted - appropriate to patient size Laryngoscope Size: Glidescope and 4 Grade View: Grade I Tube type: Parker flex tip Tube size: 7.5 mm Number of attempts: 1 Airway Equipment and Method: Rigid stylet and Video-laryngoscopy Placement Confirmation: ETT inserted through vocal cords under direct vision, positive ETCO2 and breath sounds checked- equal and bilateral Secured at: 22 cm Tube secured with: Tape Dental Injury: Teeth and Oropharynx as per pre-operative assessment  Difficulty Due To: Difficulty was anticipated, Difficult Airway- due to anterior larynx and Difficult Airway- due to limited oral opening Future Recommendations: Recommend- induction with short-acting agent, and alternative techniques readily available Comments: Attempted LMA placement without success on three attempts.  Went onto endotracheal intubation with glidescope.

## 2021-04-12 NOTE — Anesthesia Preprocedure Evaluation (Addendum)
Anesthesia Evaluation  Patient identified by MRN, date of birth, ID band Patient awake    Reviewed: Allergy & Precautions, Patient's Chart, lab work & pertinent test results  Airway Mallampati: III  TM Distance: <3 FB Neck ROM: Full    Dental no notable dental hx.    Pulmonary sleep apnea and Continuous Positive Airway Pressure Ventilation ,    Pulmonary exam normal breath sounds clear to auscultation       Cardiovascular hypertension, + dysrhythmias (both paroxysmal afib and svt noted on prior EKGs) Atrial Fibrillation and Supra Ventricular Tachycardia  Rhythm:Regular Rate:Tachycardia  eCHO from 12/05/18:Summary  Mild concentric left ventricular hypertrophy.  Normal left ventricular size and systolic function with no appreciable  segmental abnormality.  Ejection fraction is visually estimated at 60-65%.  Diastolic function is normal  Moderate to severe mitral regurgitation.  There is mild aortic sclerosis noted, with no evidence of stenosis.  No significant change compared to echo 03/2018  Signature  ------------------------------------------------------------------------------  Electronically signed by Abran Richard, MD, FACC(Interpreting physician) on  12/06/2018 10:32 AM     Neuro/Psych PSYCHIATRIC DISORDERS Bipolar Disorder Essential tremor negative neurological ROS     GI/Hepatic Neg liver ROS, GERD  ,  Endo/Other  Hypothyroidism   Renal/GU negative Renal ROS     Musculoskeletal negative musculoskeletal ROS (+)   Abdominal (+) + obese,   Peds  Hematology negative hematology ROS (+)   Anesthesia Other Findings   Reproductive/Obstetrics                            Anesthesia Physical  Anesthesia Plan  ASA: 3  Anesthesia Plan: General   Post-op Pain Management:    Induction: Intravenous  PONV Risk Score and Plan: 2 and Ondansetron, Dexamethasone and Treatment may vary due to  age or medical condition  Airway Management Planned: LMA  Additional Equipment: None  Intra-op Plan:   Post-operative Plan: Possible Post-op intubation/ventilation and Extubation in OR  Informed Consent: I have reviewed the patients History and Physical, chart, labs and discussed the procedure including the risks, benefits and alternatives for the proposed anesthesia with the patient or authorized representative who has indicated his/her understanding and acceptance.     Dental advisory given  Plan Discussed with: CRNA, Anesthesiologist and Surgeon  Anesthesia Plan Comments: (GA/LMA. If intubation needed will plan to use Glidescope and size 4 blade. Norton Blizzard, MD   )       Anesthesia Quick Evaluation

## 2021-04-12 NOTE — Discharge Instructions (Signed)
[facility] °[phone] °Post Ureteroscopy With or Without Stent Instructions ° °Definitions: ° °Ureter: The duct that transports urine from the kidney to the bladder. °Stent:   A plastic hollow tube that is placed into the ureter, from the kidney to the                 bladder to prevent the ureter from swelling shut. ° °GENERAL INSTRUCTIONS: ° °Despite the fact that no skin incisions were used, the area around the ureter and bladder is raw and irritated. The stent is a foreign body which will further irritate the bladder wall. This irritation is manifested by increased frequency of urination, both [place] and night, and by an increase in the urge to urinate. In some, the urge to urinate is present almost always. Sometimes the urge is strong enough that you may not be able to stop yourself from urinating. The only real cure is to remove the stent and then give time for the bladder wall to heal which can't be done until the danger of the ureter swelling shut has passed, which varies. ° °You may see some blood in your urine while the stent is in place and a few days afterwards. Do not be alarmed, even if the urine was clear for a while. Get off your feet and drink lots of fluids until clearing occurs. If you start to pass clots or don't improve, call us. ° °DIET: °You may return to your normal diet immediately. Because of the raw surface of your bladder, alcohol, spicy foods, acid type foods and drinks with caffeine may cause irritation or frequency and should be used in moderation. To keep your urine flowing freely and to avoid constipation, drink plenty of fluids during the [place] ( 8-10 glasses ). °Tip: Avoid cranberry juice because it is very acidic. ° °ACTIVITY: °Your physical activity doesn't need to be restricted. However, if you are very active, you may see some blood in your urine. We suggest that you reduce your activity under these circumstances until the bleeding has stopped. ° °BOWELS: °It is  important to keep your bowels regular during the postoperative period. Straining with bowel movements can cause bleeding. A bowel movement every other [place] is reasonable. Use a mild laxative if needed, such as Milk of Magnesia 2-3 tablespoons, or 2 Dulcolax tablets. Call if you continue to have problems. If you have been taking narcotics for pain, before, during or after your surgery, you may be constipated. Take a laxative if necessary. ° ° °MEDICATION: °You should resume your pre-surgery medications unless told not to. °You may take oxybutynin or flomax if prescribed for bladder spasms or discomfort from the stent °Take pain medication as directed for pain refractory to conservative management ° °PROBLEMS YOU SHOULD REPORT TO US: °· Fevers over 100.5 Fahrenheit. °· Heavy bleeding, or clots ( See above notes about blood in urine ). °· Inability to urinate. °· Drug reactions ( hives, rash, nausea, vomiting, diarrhea ). °· Severe burning or pain with urination that is not improving. ° °

## 2021-04-13 ENCOUNTER — Encounter (HOSPITAL_COMMUNITY): Payer: Self-pay | Admitting: Urology

## 2021-04-13 NOTE — Anesthesia Postprocedure Evaluation (Signed)
Anesthesia Post Note  Patient: Noah Ward  Procedure(s) Performed: CYSTOSCOPY, RIGHT URETEROSCOPY, STENT EXCHANGE (Right)     Patient location during evaluation: PACU Anesthesia Type: General Level of consciousness: awake Pain management: pain level controlled Vital Signs Assessment: post-procedure vital signs reviewed and stable Respiratory status: spontaneous breathing and respiratory function stable Cardiovascular status: stable Postop Assessment: no apparent nausea or vomiting Anesthetic complications: no   No notable events documented.  Last Vitals:  Vitals:   04/12/21 1631 04/12/21 1645  BP: 138/88 128/79  Pulse: 66   Resp: 20   Temp: 36.4 C   SpO2: 98%     Last Pain:  Vitals:   04/12/21 1631  TempSrc:   PainSc: 0-No pain                 Merlinda Frederick

## 2022-01-20 IMAGING — CT CT HEAD W/O CM
4 series · 17 of 47 positions shown, 19 images · non-contrast
Comparison: None.

CLINICAL DATA: Altered mental status.

EXAM:
CT HEAD WITHOUT CONTRAST
TECHNIQUE: Contiguous axial images were obtained from the base of the skull
through the vertex without intravenous contrast.

[Series 3: head without · axial · non-contrast · 0.43mm/px · z∈[-133,+2]mm · 7 of 37 slices shown, 9 images]
[im 5/37  brain]
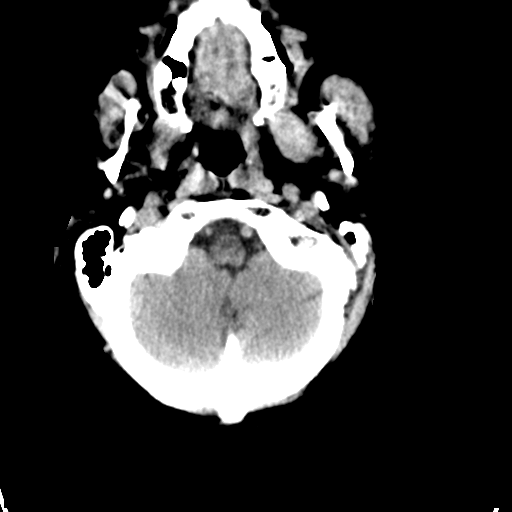
[im 5/37  bone]
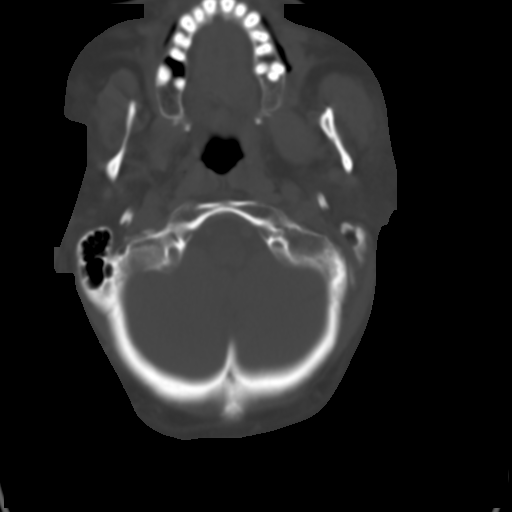
[im 10/37  brain]
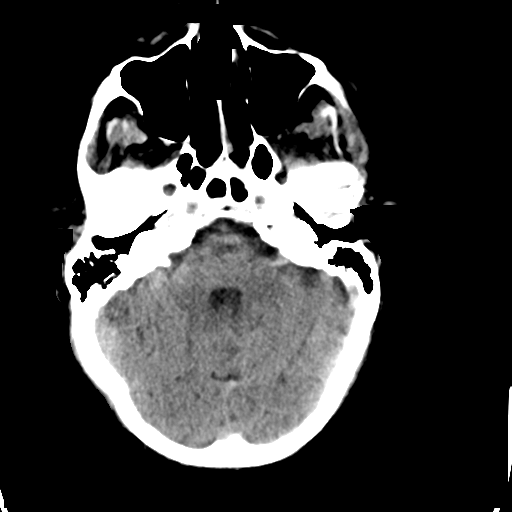
[im 14/37  brain]
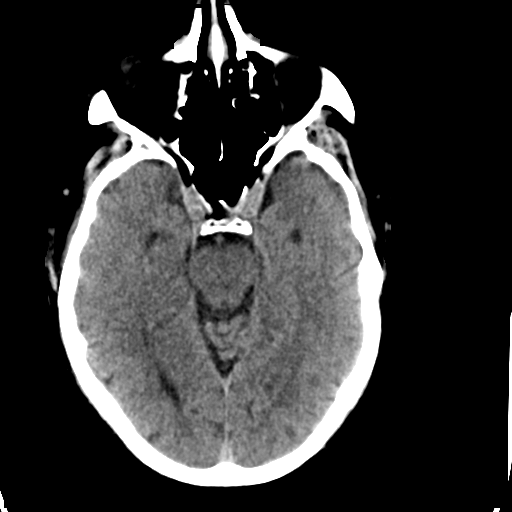
[im 19/37  brain]
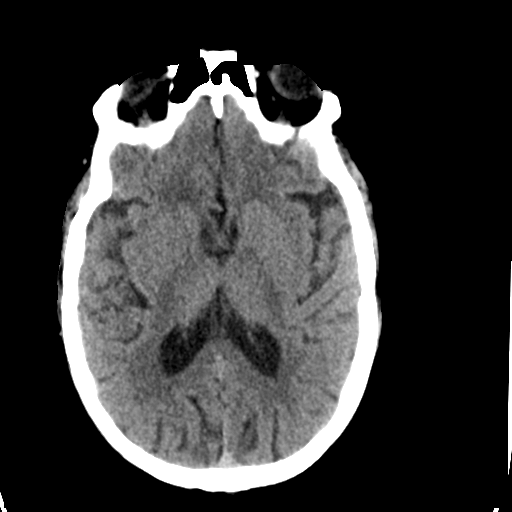
[im 23/37  brain]
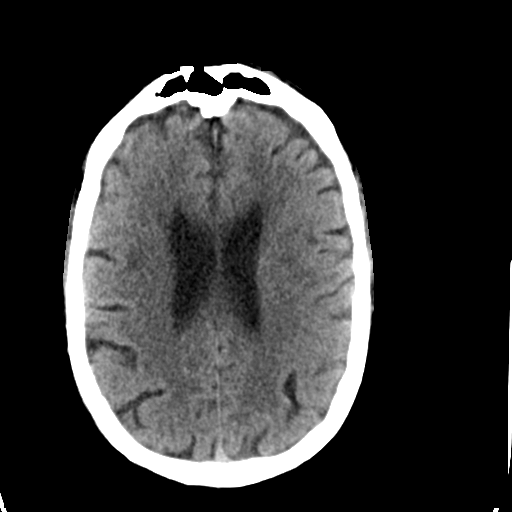
[im 23/37  bone]
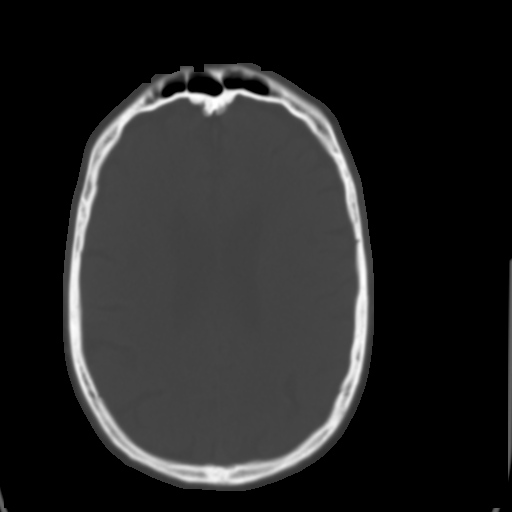
[im 28/37  brain]
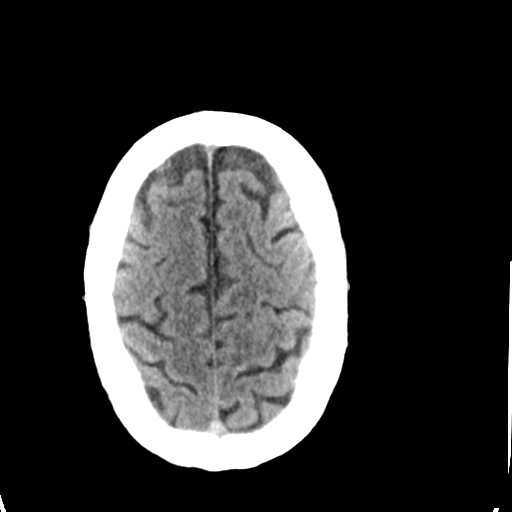
[im 32/37  brain]
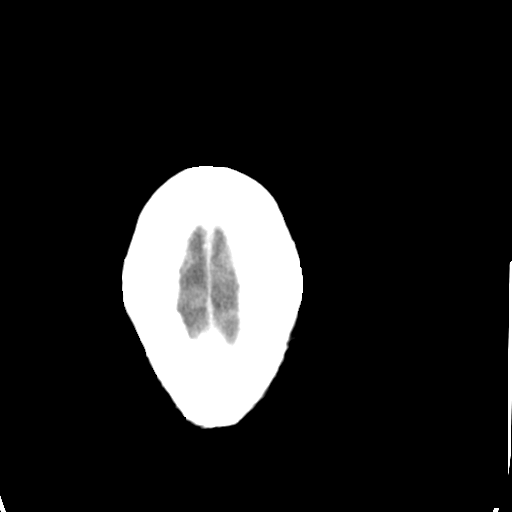

[Series 4: head bone · axial · 0.43mm/px · z∈[-135,-73]mm · 4 of 91 slices shown]
[im 10/91  bone]
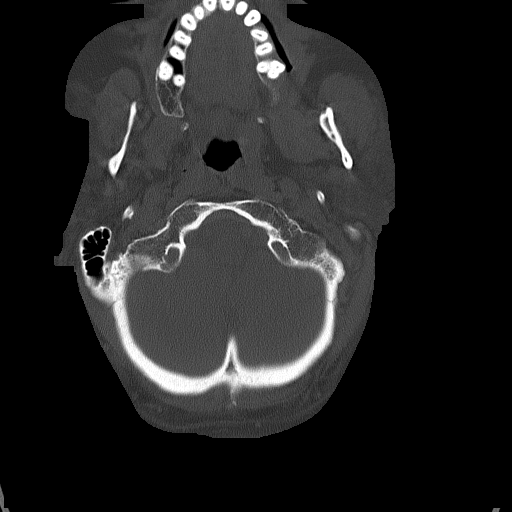
[im 19/91  bone]
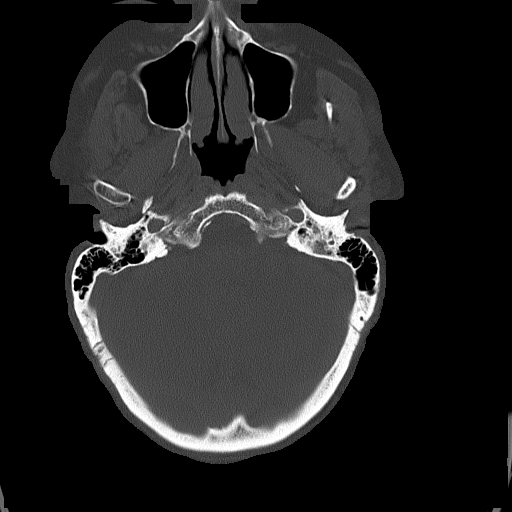
[im 28/91  bone]
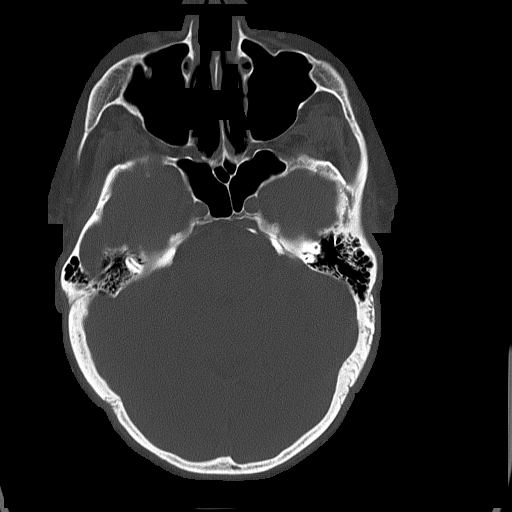
[im 41/91  bone]
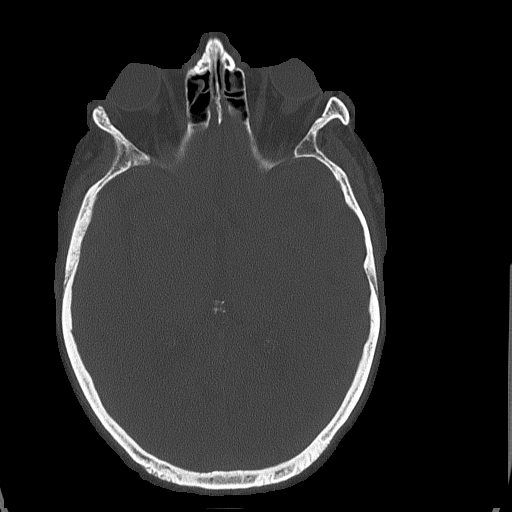

[Series 5: head without cor · coronal · non-contrast · 0.36mm/px · 3 of 62 slices shown]
[im 21/62  brain]
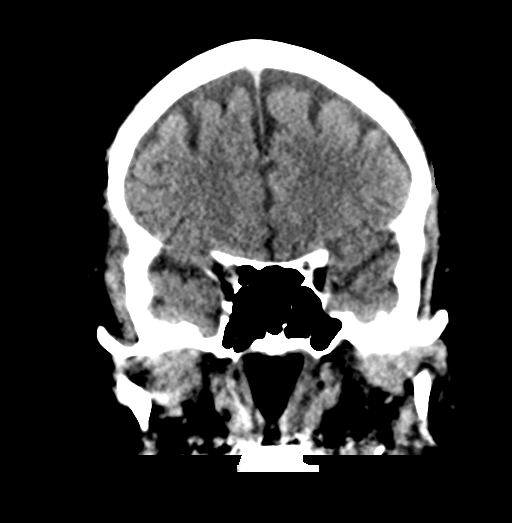
[im 28/62  brain]
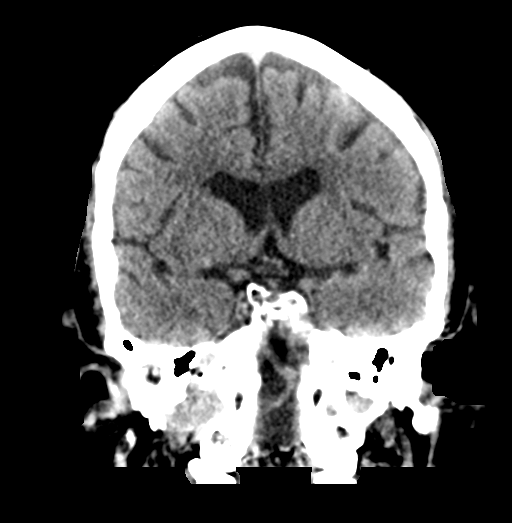
[im 34/62  brain]
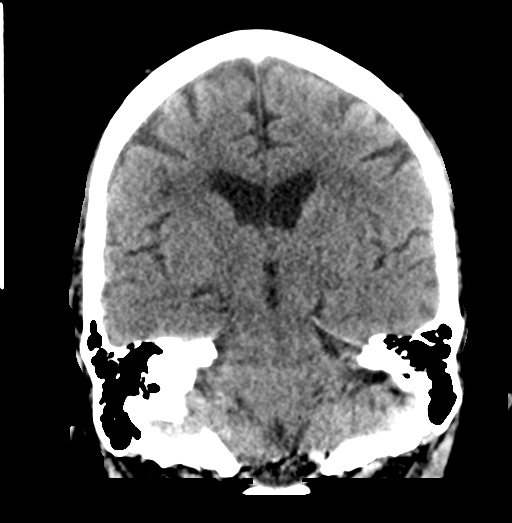

[Series 6: head without sag · sagittal · non-contrast · 0.42mm/px · 3 of 46 slices shown]
[im 16/46  brain]
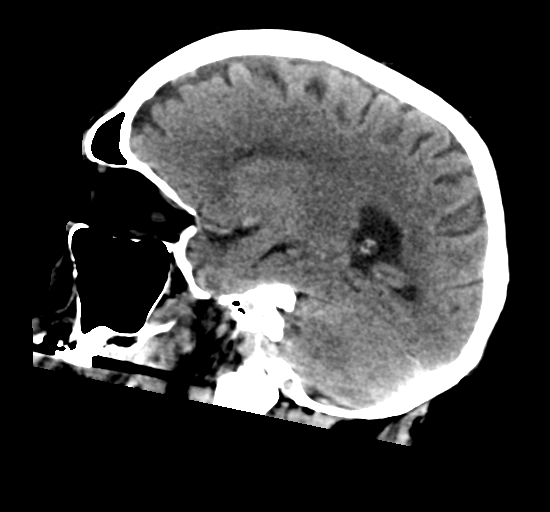
[im 23/46  brain]
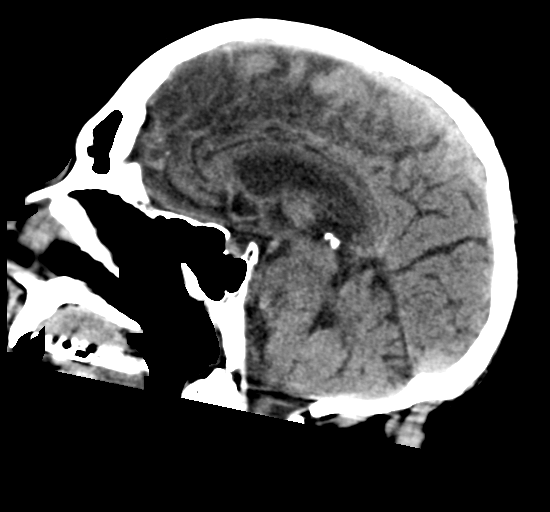
[im 31/46  brain]
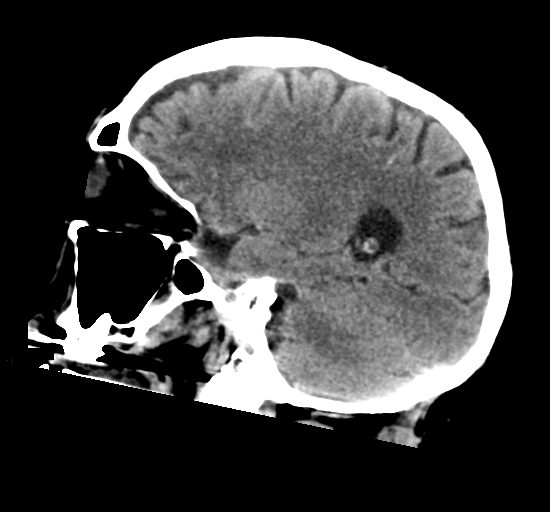

[17 of 47 positions shown; findings below may reference images not displayed]

FINDINGS: Brain: There is mild cerebral atrophy with widening of the
extra-axial spaces and ventricular dilatation.
There are areas of decreased attenuation within the white matter
tracts of the supratentorial brain, consistent with microvascular
disease changes.

Vascular: No hyperdense vessel or unexpected calcification.

Skull: Normal. Negative for fracture or focal lesion.

Sinuses/Orbits: No acute finding.

Other: None.
IMPRESSION: 1. Generalized cerebral atrophy without an acute intracranial
abnormality.

## 2022-01-20 IMAGING — RF DG RETROGRADE PYELOGRAM
1 series · 3 of 3 positions shown · non-contrast
Comparison: CT from earlier the same day

CLINICAL DATA: Right hydronephrosis with ureteral calculus

EXAM:
RETROGRADE PYELOGRAM

[Series 1: run · 3 of 3 slices shown]
[im 1/3]
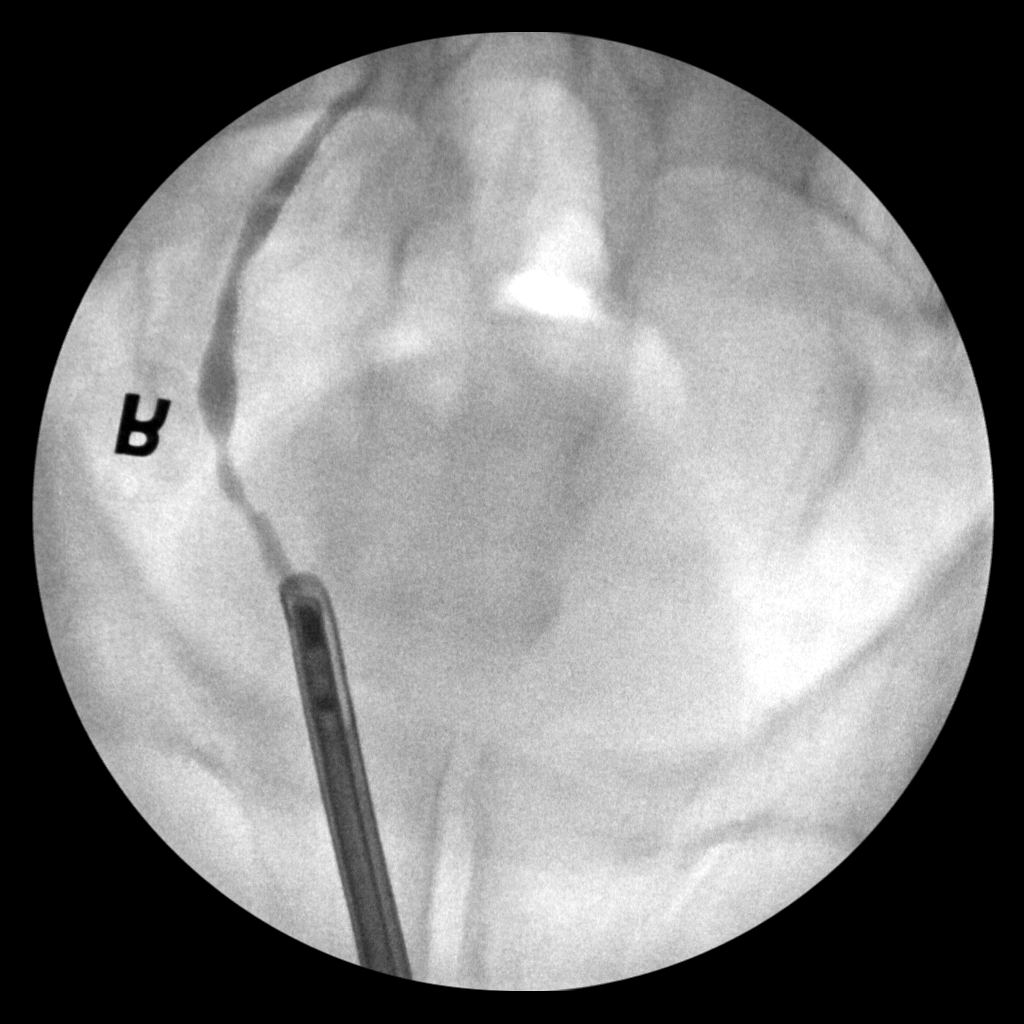
[im 2/3]
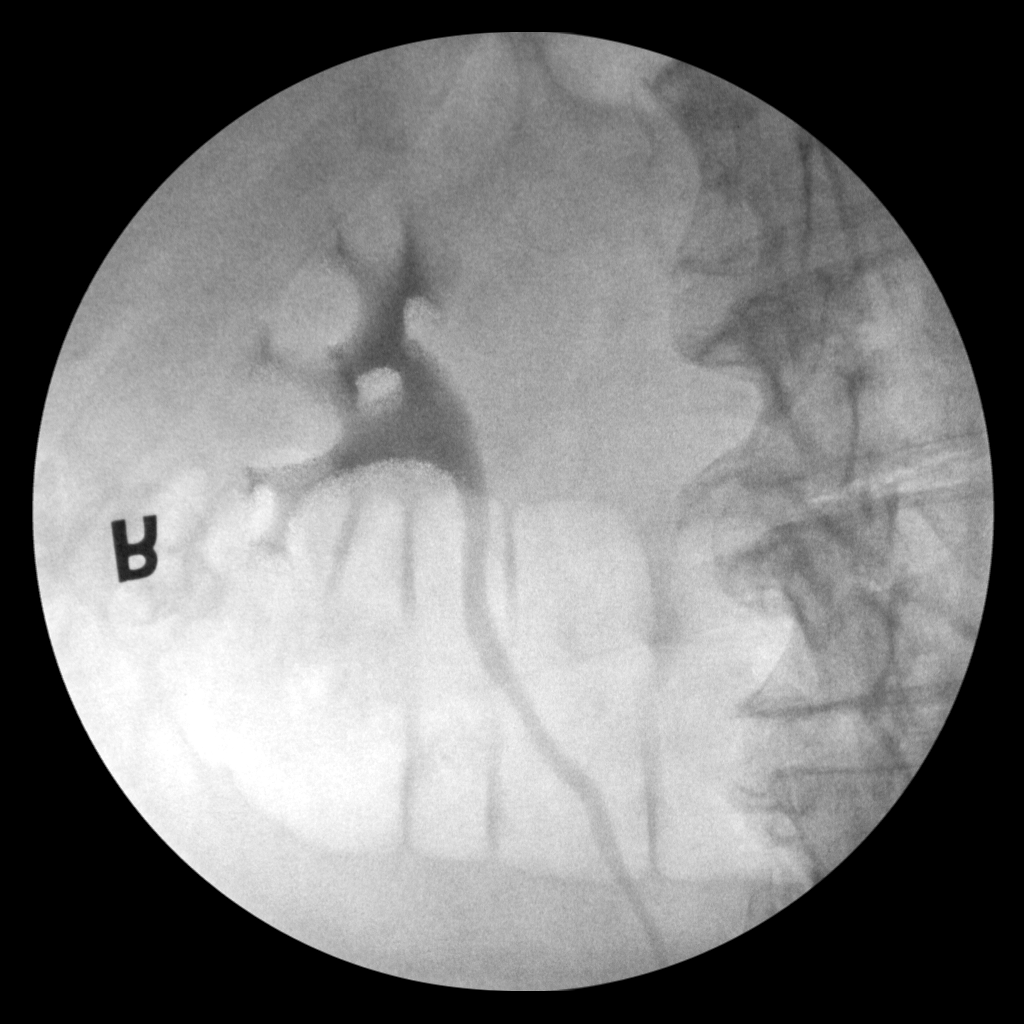
[im 3/3]
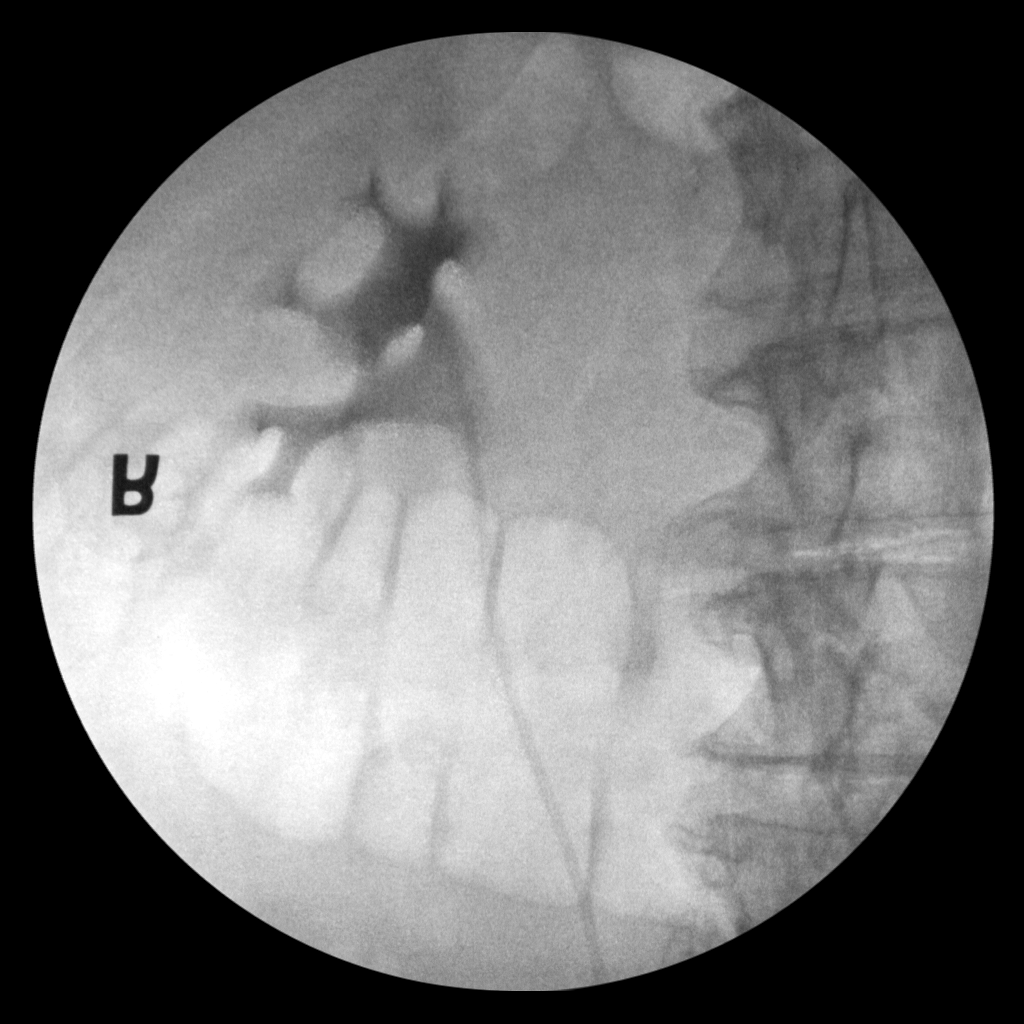

[3 of 3 positions shown; findings below may reference images not displayed]

FINDINGS: Series of fluoroscopic spot images document retrograde pyelography.
No precontrast images were submitted. Possible filling defect in the
distal right ureter. No hydronephrosis. Final image documents
catheter advancement towards the right renal collecting system.
IMPRESSION: Partially obstructing distal right ureteral calculus
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
## 2022-09-20 ENCOUNTER — Other Ambulatory Visit: Payer: Self-pay | Admitting: Rehabilitation

## 2022-09-20 DIAGNOSIS — M546 Pain in thoracic spine: Secondary | ICD-10-CM

## 2022-10-09 ENCOUNTER — Ambulatory Visit
Admission: RE | Admit: 2022-10-09 | Discharge: 2022-10-09 | Disposition: A | Discharge status: Home / Self Care | Payer: Medicare Other | Source: Ambulatory Visit | Attending: Rehabilitation | Admitting: Rehabilitation

## 2022-10-09 DIAGNOSIS — M546 Pain in thoracic spine: Secondary | ICD-10-CM

## 2023-06-06 ENCOUNTER — Emergency Department (HOSPITAL_BASED_OUTPATIENT_CLINIC_OR_DEPARTMENT_OTHER)
Admission: EM | Admit: 2023-06-06 | Discharge: 2023-06-06 | Discharge status: Against Medical Advice | Payer: Medicare Other | Attending: Emergency Medicine | Admitting: Emergency Medicine

## 2023-06-06 ENCOUNTER — Encounter (HOSPITAL_BASED_OUTPATIENT_CLINIC_OR_DEPARTMENT_OTHER): Payer: Self-pay | Admitting: Urology

## 2023-06-06 ENCOUNTER — Other Ambulatory Visit: Payer: Self-pay

## 2023-06-06 ENCOUNTER — Emergency Department (HOSPITAL_BASED_OUTPATIENT_CLINIC_OR_DEPARTMENT_OTHER): Payer: Medicare Other

## 2023-06-06 DIAGNOSIS — E039 Hypothyroidism, unspecified: Secondary | ICD-10-CM | POA: Insufficient documentation

## 2023-06-06 DIAGNOSIS — K8020 Calculus of gallbladder without cholecystitis without obstruction: Secondary | ICD-10-CM | POA: Diagnosis not present

## 2023-06-06 DIAGNOSIS — K2900 Acute gastritis without bleeding: Secondary | ICD-10-CM | POA: Diagnosis not present

## 2023-06-06 DIAGNOSIS — K219 Gastro-esophageal reflux disease without esophagitis: Secondary | ICD-10-CM | POA: Diagnosis not present

## 2023-06-06 DIAGNOSIS — R1084 Generalized abdominal pain: Secondary | ICD-10-CM | POA: Diagnosis present

## 2023-06-06 DIAGNOSIS — Z87442 Personal history of urinary calculi: Secondary | ICD-10-CM | POA: Insufficient documentation

## 2023-06-06 DIAGNOSIS — N400 Enlarged prostate without lower urinary tract symptoms: Secondary | ICD-10-CM | POA: Insufficient documentation

## 2023-06-06 DIAGNOSIS — G4733 Obstructive sleep apnea (adult) (pediatric): Secondary | ICD-10-CM | POA: Diagnosis not present

## 2023-06-06 DIAGNOSIS — I700 Atherosclerosis of aorta: Secondary | ICD-10-CM | POA: Diagnosis not present

## 2023-06-06 DIAGNOSIS — E785 Hyperlipidemia, unspecified: Secondary | ICD-10-CM | POA: Insufficient documentation

## 2023-06-06 DIAGNOSIS — I10 Essential (primary) hypertension: Secondary | ICD-10-CM | POA: Diagnosis not present

## 2023-06-06 DIAGNOSIS — K529 Noninfective gastroenteritis and colitis, unspecified: Secondary | ICD-10-CM

## 2023-06-06 DIAGNOSIS — Z79899 Other long term (current) drug therapy: Secondary | ICD-10-CM | POA: Insufficient documentation

## 2023-06-06 DIAGNOSIS — Z20822 Contact with and (suspected) exposure to covid-19: Secondary | ICD-10-CM | POA: Insufficient documentation

## 2023-06-06 LAB — URINALYSIS, ROUTINE W REFLEX MICROSCOPIC
Bilirubin Urine: NEGATIVE
Glucose, UA: NEGATIVE mg/dL
Hgb urine dipstick: NEGATIVE
Ketones, ur: NEGATIVE mg/dL
Leukocytes,Ua: NEGATIVE
Nitrite: NEGATIVE
Protein, ur: NEGATIVE mg/dL
Specific Gravity, Urine: 1.015 (ref 1.005–1.030)
pH: 7.5 (ref 5.0–8.0)

## 2023-06-06 LAB — COMPREHENSIVE METABOLIC PANEL
ALT: 10 U/L (ref 0–44)
AST: 14 U/L — ABNORMAL LOW (ref 15–41)
Albumin: 4.2 g/dL (ref 3.5–5.0)
Alkaline Phosphatase: 66 U/L (ref 38–126)
Anion gap: 7 (ref 5–15)
BUN: 18 mg/dL (ref 8–23)
CO2: 31 mmol/L (ref 22–32)
Calcium: 10.3 mg/dL (ref 8.9–10.3)
Chloride: 99 mmol/L (ref 98–111)
Creatinine, Ser: 1.31 mg/dL — ABNORMAL HIGH (ref 0.61–1.24)
GFR, Estimated: 58 mL/min — ABNORMAL LOW (ref >60)
Glucose, Bld: 157 mg/dL — ABNORMAL HIGH (ref 70–99)
Potassium: 4.7 mmol/L (ref 3.5–5.1)
Sodium: 137 mmol/L (ref 135–145)
Total Bilirubin: 0.6 mg/dL (ref <1.2)
Total Protein: 7.2 g/dL (ref 6.5–8.1)

## 2023-06-06 LAB — CBC
HCT: 45.3 % (ref 39.0–52.0)
Hemoglobin: 14.6 g/dL (ref 13.0–17.0)
MCH: 29.3 pg (ref 26.0–34.0)
MCHC: 32.2 g/dL (ref 30.0–36.0)
MCV: 91 fL (ref 80.0–100.0)
Platelets: 269 10*3/uL (ref 150–400)
RBC: 4.98 MIL/uL (ref 4.22–5.81)
RDW: 14 % (ref 11.5–15.5)
WBC: 7.8 10*3/uL (ref 4.0–10.5)
nRBC: 0 % (ref 0.0–0.2)

## 2023-06-06 LAB — RESP PANEL BY RT-PCR (RSV, FLU A&B, COVID)  RVPGX2
Influenza A by PCR: NEGATIVE
Influenza B by PCR: NEGATIVE
Resp Syncytial Virus by PCR: NEGATIVE
SARS Coronavirus 2 by RT PCR: NEGATIVE

## 2023-06-06 LAB — LACTIC ACID, PLASMA: Lactic Acid, Venous: 1.3 mmol/L (ref 0.5–1.9)

## 2023-06-06 LAB — LIPASE, BLOOD: Lipase: 30 U/L (ref 11–51)

## 2023-06-06 MED ORDER — ONDANSETRON 4 MG PO TBDP: 4.0000 mg | ORAL_TABLET | Freq: Three times a day (TID) | ORAL | 0 refills | Status: AC | PRN

## 2023-06-06 MED ORDER — PROPRANOLOL HCL 20 MG PO TABS: 20.0000 mg | ORAL_TABLET | Freq: Once | ORAL | Status: DC

## 2023-06-06 MED ORDER — ONDANSETRON HCL 4 MG/2ML IJ SOLN
4.0000 mg | Freq: Once | INTRAMUSCULAR | Status: AC
  Administered 2023-06-06: 4 mg via INTRAVENOUS
  Filled 2023-06-06: qty 2

## 2023-06-06 MED ORDER — SODIUM CHLORIDE 0.9 % IV BOLUS
1000.0000 mL | Freq: Once | INTRAVENOUS | Status: AC
  Administered 2023-06-06: 1000 mL via INTRAVENOUS

## 2023-06-06 MED ORDER — IOHEXOL 300 MG/ML  SOLN
100.0000 mL | Freq: Once | INTRAMUSCULAR | Status: AC | PRN
  Administered 2023-06-06: 100 mL via INTRAVENOUS

## 2023-06-06 MED ORDER — MORPHINE SULFATE (PF) 4 MG/ML IV SOLN
4.0000 mg | Freq: Once | INTRAVENOUS | Status: AC
  Administered 2023-06-06: 4 mg via INTRAVENOUS
  Filled 2023-06-06: qty 1

## 2023-06-06 NOTE — ED Notes (Signed)
Patient unable to urinate at this time. 

## 2023-06-06 NOTE — Discharge Instructions (Addendum)
You were seen in the emergency room today diagnosed with gastroenteritis.  Recommend following up with primary care and discussing follow-up for colonoscopy.  I would recommend liquid diet transitioning into bland diet and slowly introducing a normal diet back in.  While you were in emergency room you had significant desaturation while sleeping likely secondary to obstructive sleep apnea.  You are also tachycardic during stay and had low oxygen.  Because of this you are signing out AGAINST MEDICAL ADVICE.  I would recommend following up with primary care for further testing.  Please return to the emergency room if you start experiencing shortness of breath, chest pain, palpitations, feeling unwell or weakness.

## 2023-06-06 NOTE — ED Provider Notes (Signed)
[place] [facility] Provider Note   CSN: [id number] Arrival date & time: 06/06/23  1411     History  Chief Complaint  Patient presents with   Abdominal Pain    Noah Ward is a 72 y.o. male patient with past medical history of hypertension, bipolar, kidney stones, acid reflux, hyperlipidemia, hypothyroidism presenting with generalized abdominal pain of the left abdomen.  Patient reports that this started around 11 AM and after he ate pancakes and coffee for breakfast he had worsening abdominal pain.  Then had 1 episode of vomiting.  Pain is now diffuse throughout his abdomen.  Pain is not radiating anywhere.  She has never had pain like this in the past.  Patient tried local magnesium at home which made his abdominal pain worse.  He denies any change in bowel movements blood in the stools dysuria chest pain shortness of breath or dizziness.  Patient adamantly requesting something for pain and nausea. No recent travel.    Abdominal Pain      Home Medications Prior to Admission medications   Medication Sig Start Date End Date Taking? Authorizing Provider  acetaminophen (TYLENOL) 325 MG tablet Take 2 tablets (650 mg total) by mouth every 6 (six) hours as needed for mild pain (or Fever >/= 101). 03/26/21   Calvert Cantor, MD  amLODipine-benazepril (LOTREL) 5-20 MG capsule Take 1 capsule by mouth daily. 02/18/21   [provider]  Cholecalciferol (VITAMIN D3) 125 MCG (5000 UT) TABS Take 50,000 Units by mouth daily.    [provider]  divalproex (DEPAKOTE ER) 250 MG 24 hr tablet Take 750 mg by mouth at bedtime. 02/18/21   [provider]  ibuprofen (ADVIL) 200 MG tablet Take 400 mg by mouth every 6 (six) hours as needed for mild pain.    [provider]  oxycodone (OXY-IR) 5 MG capsule Take 1-3 capsules (5-15 mg total) by mouth every 3 (three) hours as needed. For pain. Take 1 tab for minor pain; take 2 tabs for moderate  pain and 3 tabs for severe pain 04/12/21   Ray Church III, MD  pravastatin (PRAVACHOL) 20 MG tablet Take 20 mg by mouth daily. 02/18/21   [provider]  propranolol (INDERAL) 20 MG tablet Take 20 mg by mouth 2 (two) times daily. 11/12/20   [provider]      Allergies    12 hour nasal spray [nasal spray], Crestor [rosuvastatin], Crestor [rosuvastatin], Sulfa antibiotics, Sulfa antibiotics, and Darunavir    Review of Systems   Review of Systems  Gastrointestinal:  Positive for abdominal pain.    Physical Exam Updated Vital Signs BP 132/87 (BP Location: Left Arm)   Pulse 90   Temp 97.8 F (36.6 C)   Resp 18   Ht 6\' 2"  (1.88 m)   Wt 127 kg   SpO2 100%   BMI 35.95 kg/m  Physical Exam Vitals and nursing note reviewed.  Constitutional:      General: He is not in acute distress.    Appearance: He is not ill-appearing, toxic-appearing or diaphoretic.  HENT:     Head: Normocephalic and atraumatic.     Mouth/Throat:     Mouth: Mucous membranes are moist.  Eyes:     General: No scleral icterus.    Conjunctiva/sclera: Conjunctivae normal.     Pupils: Pupils are equal, round, and reactive to light.  Cardiovascular:     Rate and Rhythm: Normal rate and regular rhythm.  Pulses: Normal pulses.     Heart sounds: Normal heart sounds.  Pulmonary:     Effort: Pulmonary effort is normal. No respiratory distress.     Breath sounds: Normal breath sounds.  Abdominal:     General: Abdomen is flat. Bowel sounds are normal. There is no distension.     Palpations: Abdomen is soft. There is no mass.     Tenderness: There is abdominal tenderness. There is guarding. There is no right CVA tenderness, left CVA tenderness or rebound.     Hernia: No hernia is present.  Musculoskeletal:     Right lower leg: No edema.     Left lower leg: No edema.  Skin:    General: Skin is warm and dry.     Coloration: Skin is not jaundiced.     Findings: No erythema or lesion.   Neurological:     General: No focal deficit present.     Mental Status: He is alert and oriented to person, place, and time. Mental status is at baseline.     ED Results / Procedures / Treatments   Labs (all labs ordered are listed, but only abnormal results are displayed) Labs Reviewed  COMPREHENSIVE METABOLIC PANEL - Abnormal; Notable for the following components:      Result Value   Glucose, Bld 157 (*)    Creatinine, Ser 1.31 (*)    AST 14 (*)    GFR, Estimated 58 (*)    All other components within normal limits  RESP PANEL BY RT-PCR (RSV, FLU A&B, COVID)  RVPGX2  LIPASE, BLOOD  CBC  URINALYSIS, ROUTINE W REFLEX MICROSCOPIC  LACTIC ACID, PLASMA    EKG EKG Interpretation Date/Time:  Tuesday June 06 2023 15:57:18 EST Ventricular Rate:  74 PR Interval:  176 QRS Duration:  117 QT Interval:  401 QTC Calculation: 445 R Axis:   107  Text Interpretation: Sinus rhythm Nonspecific intraventricular conduction delay Probable inferior infarct, old Lateral leads are also involved Confirmed by Ernie Avena ([id number]) on 06/06/2023 4:00:28 PM  Radiology CT ABDOMEN PELVIS W CONTRAST  Result Date: 06/06/2023 CLINICAL DATA:  Acute upper abdominal pain.  Vomiting. EXAM: CT ABDOMEN AND PELVIS WITH CONTRAST TECHNIQUE: Multidetector CT imaging of the abdomen and pelvis was performed using the standard protocol following bolus administration of intravenous contrast. RADIATION DOSE REDUCTION: This exam was performed according to the departmental dose-optimization program which includes automated exposure control, adjustment of the mA and/or kV according to patient size and/or use of iterative reconstruction technique. CONTRAST:  OMNIPAQUE IOHEXOL 300 MG/ML  SOLN COMPARISON:  October 29, 2021. FINDINGS: Lower chest: No acute abnormality. Hepatobiliary: Large gallstone is noted. No biliary dilatation. Liver is unremarkable. Pancreas: Unremarkable. No pancreatic ductal dilatation or  surrounding inflammatory changes. Spleen: Normal in size without focal abnormality. Adrenals/Urinary Tract: Adrenal glands appear normal. Stable bilateral renal cysts for which no further follow-up is required. No hydronephrosis or renal obstruction is noted. Urinary bladder is unremarkable. Stomach/Bowel: Stomach and appendix are unremarkable. Colon is unremarkable and nondilated. Moderately dilated and thick walled loop of small bowel is noted centrally in the abdomen with surrounding inflammatory changes, most consistent with enteritis or possibly inflammatory bowel disease. Vascular/Lymphatic: Aortic atherosclerosis. No enlarged abdominal or pelvic lymph nodes. Reproductive: Mild prostatic enlargement. Other: No abdominal wall hernia or abnormality. No abdominopelvic ascites. Musculoskeletal: No acute or significant osseous findings. IMPRESSION: Moderately dilated thick-walled small bowel loop is noted centrally in the abdomen with surrounding inflammatory changes, most consistent with arises or  possibly inflammatory bowel disease. Ischemic bowel disease cannot be excluded, although no definite pneumatosis is noted. Cholelithiasis without inflammation. Mild prostatic enlargement. Aortic Atherosclerosis (ICD10-I70.0). Electronically Signed   By: Lupita Raider M.D.   On: 06/06/2023 17:22    Procedures Procedures    Medications Ordered in ED Medications  sodium chloride 0.9 % bolus 1,000 mL (0 mLs Intravenous Stopped 06/06/23 1757)  ondansetron (ZOFRAN) injection 4 mg (4 mg Intravenous Given 06/06/23 1529)  morphine (PF) 4 MG/ML injection 4 mg (4 mg Intravenous Given 06/06/23 1529)  iohexol (OMNIPAQUE) 300 MG/ML solution 100 mL (100 mLs Intravenous Contrast Given 06/06/23 1548)    ED Course/ Medical Decision Making/ A&P Clinical Course as of 06/06/23 1927  Tue Jun 06, 2023  1913 Patient had OSA and sleeping, desat so RT placed on oxygen. [JB]    Clinical Course User Index [JB] Demonica Farrey, Horald Chestnut, PA-C                                 Medical Decision Making Amount and/or Complexity of Data Reviewed Labs: ordered. Radiology: ordered.  Risk Prescription drug management.   Carrell Cicio 72 y.o. presented today for abd pain. Working DDx includes, but not limited to, gastroenteritis, colitis, SBO, appendicitis, cholecystitis, hepatobiliary pathology, gastritis, PUD, ACS, dissection, pancreatitis, nephrolithiasis, AAA, UTI, pyelonephritis, testicular torsion.  R/o DDx: These are considered less likely than current impression due to history of present illness, physical exam, labs/imaging findings.  Review of prior external notes: none   Pmhx:  hypertension, bipolar, kidney stones, acid reflux, hyperlipidemia, hypothyroidism  Unique Tests and My Interpretation:  CBC with differential: No leukocytosis, no anemia Negative respiratory panel Lactic 1.3 CMP: No obvious electrolyte abnormality, creatinine 1.3 which appears to be patient's baseline with no drop in GFR Lipase: 30 UA: No blood, negative for nitrates negative for leukocytes   EKG: Rate, rhythm, axis, intervals all examined: sinus   Imaging:  CT Abd/Pelvis with contrast: evaluate for structural/surgical etiology of patients' severe abdominal pain.    Problem List / ED Course / Critical interventions / Medication management  Patient reporting to emergency room with abdominal pain and had several episodes of vomiting non-bloody and non-bilious.  Overall patient with no leukocytosis, no elevation in lactic, no AKI no electrolyte abnormality.  Given that CT showed enteritis and cannot rule out ischemia lactic obtained.  Patient also has cholelithiasis obtaining right upper quadrant ultrasound to further evaluate.  Patient has history of obstructive sleep apnea, while waiting right upper quadrant ultrasound patient has significant desaturation requiring 6 L while sleeping, patient was evaluated by RT. When discussing results  of  imaging with patient noticed tachycardia in room up to 130 on monitor, when trial of taking him off oxygen he dropped down into mid 80s.  No acute distress noted. Recommended further workup and offered home medications for BP/HR, patient declined. Attempted to ambulate patient with pulse ox however he is refusing and requesting immediate discharge.  I encouraged him to stay for further workup IV fluids and medication management however he understands the risk of leaving AGAINST MEDICAL ADVICE and requesting to be taking off monitor and IV taken out.  I ordered medication including morphine, zofran  Reevaluation of the patient after these medicines showed that the patient improved Patients vitals assessed. Upon arrival patient is hemodynamically stable.  I have reviewed the patients home medicines and have made adjustments as needed  Consult: None  Plan:  Patient requesting to leave AGAINST MEDICAL ADVICE.  Discussed leaving AGAINST MEDICAL ADVICE could result in worsening condition and complication. Patient still requesting discharge and declines further workup.          Final Clinical Impression(s) / ED Diagnoses Final diagnoses:  Acute gastritis without hemorrhage, unspecified gastritis type    Rx / DC Orders ED Discharge Orders          Ordered    ondansetron (ZOFRAN-ODT) 4 MG disintegrating tablet  Every 8 hours PRN        06/06/23 2036              Madyn Ivins, Horald Chestnut, PA-C 06/06/23 2304    Ernie Avena, MD 06/07/23 [phone]

## 2023-06-06 NOTE — ED Notes (Signed)
Fall risk armband Fall risk sign on door Patient wearing shoes

## 2023-06-06 NOTE — ED Triage Notes (Signed)
Pt states abdominal pain to upper abdomen that started today at 1100  Got worse after eating pancake and coffee  Had episode of vomiting x 1  Took milk of mag and it got worse

## 2023-06-06 NOTE — ED Notes (Signed)
Patient placed on O2 while sleeping after pain med. Wife stated that he has OSA but does not wear a CPAP. RN and MD both aware

## 2023-06-06 NOTE — ED Notes (Signed)
Pt given water for fluid challenge

## 2023-09-28 ENCOUNTER — Other Ambulatory Visit: Payer: Self-pay | Admitting: Orthopaedic Surgery

## 2023-09-28 DIAGNOSIS — M48062 Spinal stenosis, lumbar region with neurogenic claudication: Secondary | ICD-10-CM

## 2023-09-28 DIAGNOSIS — M5416 Radiculopathy, lumbar region: Secondary | ICD-10-CM

## 2023-10-23 ENCOUNTER — Other Ambulatory Visit

## 2023-10-29 ENCOUNTER — Ambulatory Visit
Admission: RE | Admit: 2023-10-29 | Discharge: 2023-10-29 | Disposition: A | Discharge status: Home / Self Care | Source: Ambulatory Visit | Attending: Orthopaedic Surgery | Admitting: Orthopaedic Surgery

## 2023-10-29 DIAGNOSIS — M48062 Spinal stenosis, lumbar region with neurogenic claudication: Secondary | ICD-10-CM

## 2023-10-29 DIAGNOSIS — M5416 Radiculopathy, lumbar region: Secondary | ICD-10-CM
